# Patient Record
Sex: Male | Born: 1935 | Race: White | Hispanic: No | Marital: Married | Smoking: Never smoker
Health system: Southern US, Community
[De-identification: ages and names within clinical notes are randomized; demographics above are authoritative.]

## PROBLEM LIST (undated history)

## (undated) DIAGNOSIS — T8859XA Other complications of anesthesia, initial encounter: Secondary | ICD-10-CM

## (undated) DIAGNOSIS — S46009A Unspecified injury of muscle(s) and tendon(s) of the rotator cuff of unspecified shoulder, initial encounter: Secondary | ICD-10-CM

## (undated) DIAGNOSIS — K219 Gastro-esophageal reflux disease without esophagitis: Secondary | ICD-10-CM

## (undated) DIAGNOSIS — T4145XA Adverse effect of unspecified anesthetic, initial encounter: Secondary | ICD-10-CM

## (undated) DIAGNOSIS — I4891 Unspecified atrial fibrillation: Secondary | ICD-10-CM

## (undated) DIAGNOSIS — N189 Chronic kidney disease, unspecified: Secondary | ICD-10-CM

## (undated) DIAGNOSIS — C801 Malignant (primary) neoplasm, unspecified: Secondary | ICD-10-CM

## (undated) DIAGNOSIS — F419 Anxiety disorder, unspecified: Secondary | ICD-10-CM

## (undated) DIAGNOSIS — F32A Depression, unspecified: Secondary | ICD-10-CM

## (undated) DIAGNOSIS — Z8489 Family history of other specified conditions: Secondary | ICD-10-CM

## (undated) DIAGNOSIS — M109 Gout, unspecified: Secondary | ICD-10-CM

## (undated) DIAGNOSIS — Z9289 Personal history of other medical treatment: Secondary | ICD-10-CM

## (undated) DIAGNOSIS — R0602 Shortness of breath: Secondary | ICD-10-CM

## (undated) DIAGNOSIS — I82409 Acute embolism and thrombosis of unspecified deep veins of unspecified lower extremity: Secondary | ICD-10-CM

## (undated) DIAGNOSIS — M1990 Unspecified osteoarthritis, unspecified site: Secondary | ICD-10-CM

## (undated) DIAGNOSIS — I499 Cardiac arrhythmia, unspecified: Secondary | ICD-10-CM

## (undated) DIAGNOSIS — K439 Ventral hernia without obstruction or gangrene: Secondary | ICD-10-CM

## (undated) DIAGNOSIS — N400 Enlarged prostate without lower urinary tract symptoms: Secondary | ICD-10-CM

## (undated) DIAGNOSIS — I739 Peripheral vascular disease, unspecified: Secondary | ICD-10-CM

## (undated) DIAGNOSIS — F329 Major depressive disorder, single episode, unspecified: Secondary | ICD-10-CM

## (undated) DIAGNOSIS — K5700 Diverticulitis of small intestine with perforation and abscess without bleeding: Secondary | ICD-10-CM

## (undated) DIAGNOSIS — I10 Essential (primary) hypertension: Secondary | ICD-10-CM

## (undated) HISTORY — PX: COLOSTOMY: SHX63

## (undated) HISTORY — PX: ABDOMINAL HERNIA REPAIR: SHX539

## (undated) HISTORY — PX: HERNIA REPAIR: SHX51

---

## 2002-02-03 HISTORY — PX: JOINT REPLACEMENT: SHX530

## 2008-02-04 HISTORY — PX: EYE SURGERY: SHX253

## 2011-09-04 DIAGNOSIS — K5700 Diverticulitis of small intestine with perforation and abscess without bleeding: Secondary | ICD-10-CM

## 2011-09-04 HISTORY — DX: Diverticulitis of small intestine with perforation and abscess without bleeding: K57.00

## 2011-12-05 HISTORY — PX: KYPHOPLASTY: SHX5884

## 2012-11-17 ENCOUNTER — Other Ambulatory Visit: Payer: Self-pay | Admitting: Neurosurgery

## 2012-11-17 DIAGNOSIS — M5416 Radiculopathy, lumbar region: Secondary | ICD-10-CM

## 2012-11-17 DIAGNOSIS — M546 Pain in thoracic spine: Secondary | ICD-10-CM

## 2012-11-21 ENCOUNTER — Ambulatory Visit
Admission: RE | Admit: 2012-11-21 | Discharge: 2012-11-21 | Disposition: A | Discharge status: Home / Self Care | Payer: Medicare Other | Source: Ambulatory Visit | Attending: Neurosurgery | Admitting: Neurosurgery

## 2012-11-21 DIAGNOSIS — M546 Pain in thoracic spine: Secondary | ICD-10-CM

## 2012-11-21 DIAGNOSIS — M5416 Radiculopathy, lumbar region: Secondary | ICD-10-CM

## 2012-11-29 ENCOUNTER — Other Ambulatory Visit: Payer: Self-pay | Admitting: Neurosurgery

## 2012-11-29 ENCOUNTER — Other Ambulatory Visit: Payer: Self-pay

## 2012-12-02 ENCOUNTER — Encounter (HOSPITAL_COMMUNITY): Payer: Self-pay

## 2012-12-03 ENCOUNTER — Encounter (HOSPITAL_COMMUNITY): Payer: Self-pay

## 2012-12-03 ENCOUNTER — Encounter (HOSPITAL_COMMUNITY)
Admission: RE | Admit: 2012-12-03 | Discharge: 2012-12-03 | Disposition: A | Discharge status: Home / Self Care | Payer: Medicare Other | Source: Ambulatory Visit | Attending: Neurosurgery | Admitting: Neurosurgery

## 2012-12-03 DIAGNOSIS — Z01812 Encounter for preprocedural laboratory examination: Secondary | ICD-10-CM | POA: Insufficient documentation

## 2012-12-03 HISTORY — DX: Gastro-esophageal reflux disease without esophagitis: K21.9

## 2012-12-03 HISTORY — DX: Family history of other specified conditions: Z84.89

## 2012-12-03 HISTORY — DX: Diverticulitis of small intestine with perforation and abscess without bleeding: K57.00

## 2012-12-03 HISTORY — DX: Cardiac arrhythmia, unspecified: I49.9

## 2012-12-03 HISTORY — DX: Unspecified osteoarthritis, unspecified site: M19.90

## 2012-12-03 HISTORY — DX: Personal history of other medical treatment: Z92.89

## 2012-12-03 HISTORY — DX: Chronic kidney disease, unspecified: N18.9

## 2012-12-03 HISTORY — DX: Depression, unspecified: F32.A

## 2012-12-03 HISTORY — DX: Benign prostatic hyperplasia without lower urinary tract symptoms: N40.0

## 2012-12-03 HISTORY — DX: Gout, unspecified: M10.9

## 2012-12-03 HISTORY — DX: Acute embolism and thrombosis of unspecified deep veins of unspecified lower extremity: I82.409

## 2012-12-03 HISTORY — DX: Unspecified injury of muscle(s) and tendon(s) of the rotator cuff of unspecified shoulder, initial encounter: S46.009A

## 2012-12-03 HISTORY — DX: Anxiety disorder, unspecified: F41.9

## 2012-12-03 HISTORY — DX: Major depressive disorder, single episode, unspecified: F32.9

## 2012-12-03 HISTORY — DX: Ventral hernia without obstruction or gangrene: K43.9

## 2012-12-03 HISTORY — DX: Peripheral vascular disease, unspecified: I73.9

## 2012-12-03 HISTORY — DX: Malignant (primary) neoplasm, unspecified: C80.1

## 2012-12-03 LAB — BASIC METABOLIC PANEL
Calcium: 8.9 mg/dL (ref 8.4–10.5)
Chloride: 101 mEq/L (ref 96–112)
Creatinine, Ser: 2.54 mg/dL — ABNORMAL HIGH (ref 0.50–1.35)
GFR calc non Af Amer: 23 mL/min — ABNORMAL LOW (ref >90)
Glucose, Bld: 129 mg/dL — ABNORMAL HIGH (ref 70–99)
Sodium: 134 mEq/L — ABNORMAL LOW (ref 135–145)

## 2012-12-03 LAB — CBC
Platelets: 224 10*3/uL (ref 150–400)
RBC: 4.33 MIL/uL (ref 4.22–5.81)
WBC: 22.9 10*3/uL — ABNORMAL HIGH (ref 4.0–10.5)

## 2012-12-03 LAB — SURGICAL PCR SCREEN: Staphylococcus aureus: POSITIVE — AB

## 2012-12-03 NOTE — Progress Notes (Addendum)
I spoke with Shanda Bumps at Dr Rush Farmer office and notified her of PCR swab positive for Staph and MRSA and WBC of 22.9.  Pt was afebrile and no cough, or cold symptoms.

## 2012-12-03 NOTE — Pre-Procedure Instructions (Addendum)
Christopher Marshall  12/03/2012   Your procedure is scheduled on:  Tuesday, November 4th.  Report to [facility], Main Entrance/ Entrance "A" at 8:15 AM.  Call this number if you have problems the morning of surgery: [phone]   Remember:   Do not eat food or drink liquids after midnight.   Take these medicines the morning of surgery with A SIP OF WATER: allopurinol (ZYLOPRIM), amLODipine (NORVASC),  esomeprazole (NEXIUM), finasteride (PROSCAR), tamsulosin (FLOMAX).  Stop taking Aspirin, Coumadin, Plavix, Effient and herbal medications.  Do not take any NSAIDS ie:  Ibuprofen, Advil, Naproxen.   Do not wear jewelry, make-up or nail polish.  Do not wear lotions, powders, or perfumes. You may wear deodorant.  Do not shave 48 hours prior to surgery. Men may shave face and neck.  Do not bring valuables to the hospital.  [facility] is not responsible  for any belongings or valuables.               Contacts, dentures or bridgework may not be worn into surgery.  Leave suitcase in the car. After surgery it may be brought to your room.  For patients admitted to the hospital, discharge time is determined by your treatment team.               Patients discharged the day of surgery will not be allowed to drive home.  Name and phone number of your driver:-             Special Instructions: Shower using CHG 2 nights before surgery and the night before surgery.  If you shower the day of surgery use CHG.  Use special wash - you have one bottle of CHG for all showers.  You should use approximately 1/3 of the bottle for each shower.   Please read over the following fact sheets that you were given: Pain Booklet, Coughing and Deep Breathing and Surgical Site Infection Prevention

## 2012-12-06 ENCOUNTER — Encounter (HOSPITAL_COMMUNITY): Payer: Self-pay

## 2012-12-06 ENCOUNTER — Encounter (HOSPITAL_COMMUNITY): Payer: Self-pay | Admitting: Vascular Surgery

## 2012-12-06 MED ORDER — CEFAZOLIN SODIUM-DEXTROSE 2-3 GM-% IV SOLR: 2.0000 g | INTRAVENOUS | Status: DC

## 2012-12-06 NOTE — Progress Notes (Signed)
Anesthesia Chart Review:  Patient is a 78 year old male scheduled for T11 and T12 kyphoplasty on 12/07/12 by Dr. Venetia Maxon.  History includes non-smoker, PAD, GERD, BLE DVT, CKD with baseline Cr 1.8, anxiety, depression, dysrhythmia ("irregualr one time"), bilateral IHR, perforated diverticulitis s/p colon resection with colostomy, psoriatic and osteoarthritis, gout, skin cancer (BCC), right joint replacement, family history of post-operative N/V, left rotator cuff injury. PCP is listed as Dr. Suzy Bouchard in [place]. Patient has seen urologist Dr. August Saucer Clower in 07/2012 for voiding difficulties but no further urology follow-up was recommended.  Meds includes Remeron, allopurinol, Norvasc, Vitamin D, Colace, Nexium, Fergon, Proscar, Isopto tears, KCl, prednisone, Flomax, Coumadin. Dr. Fredrich Birks office has already instructed patient on when to hold Coumadin.  CXR and EKG received from [facility] were both > 69 year old.  (They were done on 12/02/11.)  They will need to be done on the day of surgery.  Preoperative labs noted.  Na 134, K 5.5.  BUN 41, Cr 2.54, glucose 129.  WBC 22.9K.  He is on chronic steroids.  Earlier today I called and received comparison labs from Dr. Kathee Polite office.  His last WBC was 14.2, BUN 35, Cr 1.8 on 09/28/12.  I spoke with Shanda Bumps at Dr. Fredrich Birks office multiple times today.  Dr. Venetia Maxon had already reviewed labs and had her call and review labs with Dr. Dorna Leitz.  She reports that Dr. Dorna Leitz is okay elevated WBC since patient is on steroids, but Dr. Venetia Maxon would still like a CBC repeated on the day of surgery. Dr. Grant Fontana also recommended letting Dr. Eugenie Birks know about renal labs, but Dr. Eugenie Birks told Shanda Bumps that he is no longer following patient and would not offer additional input.    I reviewed with Dr. Gelene Mink.  With increase in renal function to > 2.5 would recommend formal nephrology consult if this is an elective procedure.  Shanda Bumps will review with Dr. Venetia Maxon, and he was given Dr.  Thornton Dales portable phone number if any questions/concerns.    I would anticipate case being rescheduled.  If so, patient will still need CBC, PT/PTT, BMET done and an EKG, and CXR if not done within the past year.    Velna Ochs [facility] Short Stay Center/Anesthesiology Phone [phone] 12/06/2012 4:47 PM

## 2012-12-07 ENCOUNTER — Inpatient Hospital Stay (HOSPITAL_COMMUNITY): Admission: RE | Admit: 2012-12-07 | Payer: Medicare Other | Source: Ambulatory Visit | Admitting: Neurosurgery

## 2012-12-07 ENCOUNTER — Encounter (HOSPITAL_COMMUNITY): Admission: RE | Payer: Self-pay | Source: Ambulatory Visit

## 2012-12-07 SURGERY — KYPHOPLASTY
Anesthesia: General

## 2013-01-03 ENCOUNTER — Other Ambulatory Visit: Payer: Self-pay | Admitting: Neurosurgery

## 2013-01-06 ENCOUNTER — Encounter (HOSPITAL_COMMUNITY): Payer: Self-pay | Admitting: Vascular Surgery

## 2013-01-06 MED ORDER — VANCOMYCIN HCL IN DEXTROSE 1-5 GM/200ML-% IV SOLN
1000.0000 mg | INTRAVENOUS | Status: AC
  Administered 2013-01-07: 1000 mg via INTRAVENOUS
  Filled 2013-01-06: qty 200

## 2013-01-06 NOTE — Progress Notes (Signed)
Anesthesia follow-up: See my note from 12/03/12.  Patient's surgery is now scheduled for 01/07/13.  He is scheduled to be a same day work-up.  (He had not seen cardiology prior to rescheduling, and this week he has had cardiology visits or testing nearly every day in [place].)   Since October, he has been seen and cleared by nephrologist Dr. Cyndia Diver of [facility].  He was diagnosed with stage IV CKD and his Cr was up to 3.1.  Prior abdominal CT in 2013 showed right renal cyst with normal kidney size.    He saw cardiologist Dr. Yehuda Budd on 01/04/13. He was found to be in afib of uncertain duration. He was rate controlled.  Preoperative stress and echo were done showing mildly reduced LV function, no significant valvular diseae, and no ischemia on nuclear stress test.  Ultimately, he was felt low risk from a cardiac standpoint for planned procedure.    Echo on 01/06/13 showed normal LV size with moderate symmetric LVH, mild global hypokinesis, EF 45-50%, mitral inflow pattern is uniphasic due to afib, normal pulmonary artery pressure, mild AI, MR, TR, PI.  Nuclear stress test on 01/05/13 showed normal LVEDV, no TID. Small in size, moderate in severity fixed inferoapical wall defect consistent with attenuation artifact, no ischemia.  Grossly normal LV function and wall motion by visual estimation. EF by computer was 39%, but this is likely due to gating artifact from afib.    He will need a CXR and labs tomorrow, and perhaps another EKG if one not received from [facility].  Velna Ochs [facility] Short Stay Center/Anesthesiology Phone [phone] 01/06/2013 12:07 PM

## 2013-01-07 ENCOUNTER — Ambulatory Visit (HOSPITAL_COMMUNITY)
Admission: RE | Admit: 2013-01-07 | Discharge: 2013-01-08 | Disposition: A | Discharge status: Home / Self Care | Payer: Medicare Other | Source: Ambulatory Visit | Attending: Neurosurgery | Admitting: Neurosurgery

## 2013-01-07 ENCOUNTER — Encounter (HOSPITAL_COMMUNITY): Payer: Self-pay | Admitting: *Deleted

## 2013-01-07 ENCOUNTER — Ambulatory Visit (HOSPITAL_COMMUNITY): Payer: Medicare Other

## 2013-01-07 ENCOUNTER — Encounter (HOSPITAL_COMMUNITY): Payer: Medicare Other | Admitting: Vascular Surgery

## 2013-01-07 ENCOUNTER — Inpatient Hospital Stay (HOSPITAL_COMMUNITY): Payer: Medicare Other

## 2013-01-07 ENCOUNTER — Encounter (HOSPITAL_COMMUNITY)
Admission: RE | Disposition: A | Discharge status: Home / Self Care | Payer: Self-pay | Source: Ambulatory Visit | Attending: Neurosurgery

## 2013-01-07 ENCOUNTER — Inpatient Hospital Stay (HOSPITAL_COMMUNITY): Payer: Medicare Other | Admitting: Vascular Surgery

## 2013-01-07 DIAGNOSIS — Y834 Other reconstructive surgery as the cause of abnormal reaction of the patient, or of later complication, without mention of misadventure at the time of the procedure: Secondary | ICD-10-CM | POA: Insufficient documentation

## 2013-01-07 DIAGNOSIS — M545 Low back pain, unspecified: Secondary | ICD-10-CM | POA: Insufficient documentation

## 2013-01-07 DIAGNOSIS — I9589 Other hypotension: Secondary | ICD-10-CM | POA: Insufficient documentation

## 2013-01-07 DIAGNOSIS — M4000 Postural kyphosis, site unspecified: Secondary | ICD-10-CM | POA: Insufficient documentation

## 2013-01-07 DIAGNOSIS — X500XXA Overexertion from strenuous movement or load, initial encounter: Secondary | ICD-10-CM | POA: Insufficient documentation

## 2013-01-07 DIAGNOSIS — D649 Anemia, unspecified: Secondary | ICD-10-CM | POA: Insufficient documentation

## 2013-01-07 DIAGNOSIS — J810 Acute pulmonary edema: Secondary | ICD-10-CM

## 2013-01-07 DIAGNOSIS — I4891 Unspecified atrial fibrillation: Secondary | ICD-10-CM | POA: Insufficient documentation

## 2013-01-07 DIAGNOSIS — S22080A Wedge compression fracture of T11-T12 vertebra, initial encounter for closed fracture: Secondary | ICD-10-CM

## 2013-01-07 DIAGNOSIS — M4310 Spondylolisthesis, site unspecified: Secondary | ICD-10-CM | POA: Insufficient documentation

## 2013-01-07 DIAGNOSIS — R0902 Hypoxemia: Secondary | ICD-10-CM

## 2013-01-07 DIAGNOSIS — E872 Acidosis, unspecified: Secondary | ICD-10-CM | POA: Insufficient documentation

## 2013-01-07 DIAGNOSIS — S22009A Unspecified fracture of unspecified thoracic vertebra, initial encounter for closed fracture: Principal | ICD-10-CM | POA: Insufficient documentation

## 2013-01-07 DIAGNOSIS — I129 Hypertensive chronic kidney disease with stage 1 through stage 4 chronic kidney disease, or unspecified chronic kidney disease: Secondary | ICD-10-CM | POA: Insufficient documentation

## 2013-01-07 DIAGNOSIS — I2510 Atherosclerotic heart disease of native coronary artery without angina pectoris: Secondary | ICD-10-CM | POA: Insufficient documentation

## 2013-01-07 DIAGNOSIS — R0602 Shortness of breath: Secondary | ICD-10-CM | POA: Insufficient documentation

## 2013-01-07 DIAGNOSIS — I959 Hypotension, unspecified: Secondary | ICD-10-CM

## 2013-01-07 DIAGNOSIS — N184 Chronic kidney disease, stage 4 (severe): Secondary | ICD-10-CM | POA: Insufficient documentation

## 2013-01-07 DIAGNOSIS — Z7901 Long term (current) use of anticoagulants: Secondary | ICD-10-CM | POA: Insufficient documentation

## 2013-01-07 DIAGNOSIS — M810 Age-related osteoporosis without current pathological fracture: Secondary | ICD-10-CM | POA: Insufficient documentation

## 2013-01-07 HISTORY — PX: KYPHOPLASTY: SHX5884

## 2013-01-07 HISTORY — DX: Shortness of breath: R06.02

## 2013-01-07 HISTORY — DX: Essential (primary) hypertension: I10

## 2013-01-07 HISTORY — DX: Unspecified atrial fibrillation: I48.91

## 2013-01-07 LAB — CBC
HCT: 35.7 % — ABNORMAL LOW (ref 39.0–52.0)
Hemoglobin: 11.7 g/dL — ABNORMAL LOW (ref 13.0–17.0)
MCH: 29.8 pg (ref 26.0–34.0)
MCV: 91.1 fL (ref 78.0–100.0)
RBC: 3.92 MIL/uL — ABNORMAL LOW (ref 4.22–5.81)
RDW: 15.6 % — ABNORMAL HIGH (ref 11.5–15.5)

## 2013-01-07 LAB — GLUCOSE, CAPILLARY
Glucose-Capillary: 147 mg/dL — ABNORMAL HIGH (ref 70–99)
Glucose-Capillary: 118 mg/dL — ABNORMAL HIGH (ref 70–99)

## 2013-01-07 LAB — PROTIME-INR
INR: 1.67 — ABNORMAL HIGH (ref 0.00–1.49)
Prothrombin Time: 19.2 seconds — ABNORMAL HIGH (ref 11.6–15.2)

## 2013-01-07 LAB — BASIC METABOLIC PANEL
BUN: 58 mg/dL — ABNORMAL HIGH (ref 6–23)
CO2: 18 mEq/L — ABNORMAL LOW (ref 19–32)
Glucose, Bld: 106 mg/dL — ABNORMAL HIGH (ref 70–99)
Potassium: 4.2 mEq/L (ref 3.5–5.1)
Sodium: 135 mEq/L (ref 135–145)

## 2013-01-07 LAB — TROPONIN I
Troponin I: <0.3 ng/mL (ref <0.30)
Troponin I: <0.3 ng/mL (ref <0.30)
Troponin I: <0.3 ng/mL (ref <0.30)

## 2013-01-07 LAB — SURGICAL PCR SCREEN: MRSA, PCR: POSITIVE — AB

## 2013-01-07 SURGERY — KYPHOPLASTY
Anesthesia: General

## 2013-01-07 MED ORDER — MUPIROCIN 2 % EX OINT
TOPICAL_OINTMENT | Freq: Two times a day (BID) | CUTANEOUS | Status: DC
  Administered 2013-01-07 – 2013-01-08 (×3): via TOPICAL
  Filled 2013-01-07: qty 22

## 2013-01-07 MED ORDER — AMLODIPINE BESYLATE 5 MG PO TABS: 5.0000 mg | ORAL_TABLET | Freq: Every day | ORAL | Status: DC

## 2013-01-07 MED ORDER — PHENYLEPHRINE HCL 10 MG/ML IJ SOLN
30.0000 ug/min | INTRAVENOUS | Status: DC
  Administered 2013-01-07: 40 ug/min via INTRAVENOUS
  Filled 2013-01-07: qty 1

## 2013-01-07 MED ORDER — SODIUM CHLORIDE 0.9 % IV SOLN
INTRAVENOUS | Status: DC | PRN
  Administered 2013-01-07: 07:00:00 via INTRAVENOUS

## 2013-01-07 MED ORDER — LIDOCAINE-EPINEPHRINE 1 %-1:100000 IJ SOLN
INTRAMUSCULAR | Status: DC | PRN
  Administered 2013-01-07: 7.5 mL

## 2013-01-07 MED ORDER — ALBUMIN HUMAN 5 % IV SOLN
INTRAVENOUS | Status: DC | PRN
  Administered 2013-01-07: 08:00:00 via INTRAVENOUS

## 2013-01-07 MED ORDER — PANTOPRAZOLE SODIUM 40 MG PO TBEC: 40.0000 mg | DELAYED_RELEASE_TABLET | Freq: Every day | ORAL | Status: DC

## 2013-01-07 MED ORDER — ONDANSETRON HCL 4 MG/2ML IJ SOLN: 4.0000 mg | INTRAMUSCULAR | Status: DC | PRN

## 2013-01-07 MED ORDER — PANTOPRAZOLE SODIUM 40 MG PO TBEC
40.0000 mg | DELAYED_RELEASE_TABLET | Freq: Every day | ORAL | Status: DC
  Administered 2013-01-08: 40 mg via ORAL
  Filled 2013-01-07: qty 1

## 2013-01-07 MED ORDER — ALUM & MAG HYDROXIDE-SIMETH 200-200-20 MG/5ML PO SUSP: 30.0000 mL | Freq: Four times a day (QID) | ORAL | Status: DC | PRN

## 2013-01-07 MED ORDER — HYDROMORPHONE HCL PF 1 MG/ML IJ SOLN: 0.5000 mg | INTRAMUSCULAR | Status: DC | PRN

## 2013-01-07 MED ORDER — ACETAMINOPHEN 325 MG PO TABS: 650.0000 mg | ORAL_TABLET | ORAL | Status: DC | PRN

## 2013-01-07 MED ORDER — METOPROLOL TARTRATE 25 MG PO TABS
25.0000 mg | ORAL_TABLET | Freq: Two times a day (BID) | ORAL | Status: DC
  Filled 2013-01-07: qty 1

## 2013-01-07 MED ORDER — POLYETHYLENE GLYCOL 3350 17 G PO PACK
17.0000 g | PACK | Freq: Every day | ORAL | Status: DC | PRN
  Filled 2013-01-07: qty 1

## 2013-01-07 MED ORDER — ZOLPIDEM TARTRATE 5 MG PO TABS: 5.0000 mg | ORAL_TABLET | Freq: Every evening | ORAL | Status: DC | PRN

## 2013-01-07 MED ORDER — FENTANYL CITRATE 0.05 MG/ML IJ SOLN
INTRAMUSCULAR | Status: DC | PRN
  Administered 2013-01-07 (×2): 50 ug via INTRAVENOUS

## 2013-01-07 MED ORDER — HYDROMORPHONE HCL PF 1 MG/ML IJ SOLN: 0.2500 mg | INTRAMUSCULAR | Status: DC | PRN

## 2013-01-07 MED ORDER — FENTANYL CITRATE 0.05 MG/ML IJ SOLN: 50.0000 ug | Freq: Once | INTRAMUSCULAR | Status: DC

## 2013-01-07 MED ORDER — BISACODYL 10 MG RE SUPP: 10.0000 mg | Freq: Every day | RECTAL | Status: DC | PRN

## 2013-01-07 MED ORDER — DOCUSATE SODIUM 100 MG PO CAPS: 100.0000 mg | ORAL_CAPSULE | Freq: Two times a day (BID) | ORAL | Status: DC | PRN

## 2013-01-07 MED ORDER — TAMSULOSIN HCL 0.4 MG PO CAPS
0.4000 mg | ORAL_CAPSULE | Freq: Every day | ORAL | Status: DC
  Administered 2013-01-07: 0.4 mg via ORAL
  Filled 2013-01-07 (×2): qty 1

## 2013-01-07 MED ORDER — PHENOL 1.4 % MT LIQD: 1.0000 | OROMUCOSAL | Status: DC | PRN

## 2013-01-07 MED ORDER — FINASTERIDE 5 MG PO TABS
5.0000 mg | ORAL_TABLET | Freq: Every day | ORAL | Status: DC
  Administered 2013-01-08: 5 mg via ORAL
  Filled 2013-01-07: qty 1

## 2013-01-07 MED ORDER — PHENYLEPHRINE HCL 10 MG/ML IJ SOLN
10.0000 mg | INTRAVENOUS | Status: DC | PRN
  Administered 2013-01-07: 200 ug/min via INTRAVENOUS

## 2013-01-07 MED ORDER — PANTOPRAZOLE SODIUM 40 MG IV SOLR: 40.0000 mg | Freq: Every day | INTRAVENOUS | Status: DC

## 2013-01-07 MED ORDER — HYDROCORTISONE SOD SUCCINATE 100 MG IJ SOLR
50.0000 mg | Freq: Four times a day (QID) | INTRAMUSCULAR | Status: DC
  Administered 2013-01-07 – 2013-01-08 (×5): 50 mg via INTRAVENOUS
  Filled 2013-01-07 (×8): qty 1

## 2013-01-07 MED ORDER — PREDNISONE 2.5 MG PO TABS
12.5000 mg | ORAL_TABLET | Freq: Every day | ORAL | Status: DC
  Filled 2013-01-07: qty 1

## 2013-01-07 MED ORDER — SODIUM CHLORIDE 0.9 % IV BOLUS (SEPSIS)
500.0000 mL | Freq: Once | INTRAVENOUS | Status: AC
  Administered 2013-01-07: 500 mL via INTRAVENOUS

## 2013-01-07 MED ORDER — GLYCOPYRROLATE 0.2 MG/ML IJ SOLN
INTRAMUSCULAR | Status: DC | PRN
  Administered 2013-01-07: .8 mg via INTRAVENOUS

## 2013-01-07 MED ORDER — ONDANSETRON HCL 4 MG/2ML IJ SOLN
INTRAMUSCULAR | Status: DC | PRN
  Administered 2013-01-07: 4 mg via INTRAVENOUS

## 2013-01-07 MED ORDER — SODIUM CHLORIDE 0.9 % IJ SOLN
3.0000 mL | Freq: Two times a day (BID) | INTRAMUSCULAR | Status: DC
  Administered 2013-01-07: 3 mL via INTRAVENOUS

## 2013-01-07 MED ORDER — INSULIN ASPART 100 UNIT/ML ~~LOC~~ SOLN
0.0000 [IU] | SUBCUTANEOUS | Status: DC
  Administered 2013-01-07 (×3): 2 [IU] via SUBCUTANEOUS

## 2013-01-07 MED ORDER — MENTHOL 3 MG MT LOZG: 1.0000 | LOZENGE | OROMUCOSAL | Status: DC | PRN

## 2013-01-07 MED ORDER — NEOSTIGMINE METHYLSULFATE 1 MG/ML IJ SOLN
INTRAMUSCULAR | Status: DC | PRN
  Administered 2013-01-07: 5 mg via INTRAVENOUS

## 2013-01-07 MED ORDER — PHENYLEPHRINE HCL 10 MG/ML IJ SOLN
INTRAMUSCULAR | Status: DC | PRN
  Administered 2013-01-07 (×3): 120 ug via INTRAVENOUS

## 2013-01-07 MED ORDER — SENNA 8.6 MG PO TABS
1.0000 | ORAL_TABLET | Freq: Two times a day (BID) | ORAL | Status: DC
  Administered 2013-01-07 – 2013-01-08 (×2): 8.6 mg via ORAL
  Filled 2013-01-07 (×4): qty 1

## 2013-01-07 MED ORDER — 0.9 % SODIUM CHLORIDE (POUR BTL) OPTIME
TOPICAL | Status: DC | PRN
  Administered 2013-01-07: 1000 mL

## 2013-01-07 MED ORDER — OXYCODONE-ACETAMINOPHEN 5-325 MG PO TABS: 1.0000 | ORAL_TABLET | ORAL | Status: DC | PRN

## 2013-01-07 MED ORDER — ALLOPURINOL 100 MG PO TABS
100.0000 mg | ORAL_TABLET | Freq: Every day | ORAL | Status: DC
  Administered 2013-01-08: 100 mg via ORAL
  Filled 2013-01-07: qty 1

## 2013-01-07 MED ORDER — FLEET ENEMA 7-19 GM/118ML RE ENEM
1.0000 | ENEMA | Freq: Once | RECTAL | Status: AC | PRN
  Filled 2013-01-07: qty 1

## 2013-01-07 MED ORDER — BUPIVACAINE HCL 0.5 % IJ SOLN
INTRAMUSCULAR | Status: DC | PRN
  Administered 2013-01-07: 7.5 mL

## 2013-01-07 MED ORDER — ROCURONIUM BROMIDE 100 MG/10ML IV SOLN
INTRAVENOUS | Status: DC | PRN
  Administered 2013-01-07: 40 mg via INTRAVENOUS

## 2013-01-07 MED ORDER — DOCUSATE SODIUM 100 MG PO CAPS
100.0000 mg | ORAL_CAPSULE | Freq: Two times a day (BID) | ORAL | Status: DC
  Administered 2013-01-07 – 2013-01-08 (×2): 100 mg via ORAL
  Filled 2013-01-07 (×4): qty 1

## 2013-01-07 MED ORDER — SODIUM CHLORIDE 0.9 % IV SOLN: 250.0000 mL | INTRAVENOUS | Status: DC

## 2013-01-07 MED ORDER — ACETAMINOPHEN 650 MG RE SUPP: 650.0000 mg | RECTAL | Status: DC | PRN

## 2013-01-07 MED ORDER — LIDOCAINE HCL (CARDIAC) 20 MG/ML IV SOLN
INTRAVENOUS | Status: DC | PRN
  Administered 2013-01-07: 80 mg via INTRAVENOUS

## 2013-01-07 MED ORDER — METHOCARBAMOL 500 MG PO TABS
500.0000 mg | ORAL_TABLET | Freq: Four times a day (QID) | ORAL | Status: DC | PRN
  Filled 2013-01-07: qty 1

## 2013-01-07 MED ORDER — MIDAZOLAM HCL 2 MG/2ML IJ SOLN: 1.0000 mg | INTRAMUSCULAR | Status: DC | PRN

## 2013-01-07 MED ORDER — CYCLOBENZAPRINE HCL 5 MG PO TABS
5.0000 mg | ORAL_TABLET | Freq: Three times a day (TID) | ORAL | Status: DC | PRN
  Filled 2013-01-07: qty 1

## 2013-01-07 MED ORDER — METHOCARBAMOL 100 MG/ML IJ SOLN
500.0000 mg | Freq: Four times a day (QID) | INTRAVENOUS | Status: DC | PRN
  Filled 2013-01-07: qty 5

## 2013-01-07 MED ORDER — HYDROMORPHONE HCL 2 MG PO TABS: 2.0000 mg | ORAL_TABLET | ORAL | Status: DC | PRN

## 2013-01-07 MED ORDER — EPHEDRINE SULFATE 50 MG/ML IJ SOLN
INTRAMUSCULAR | Status: DC | PRN
  Administered 2013-01-07 (×3): 5 mg via INTRAVENOUS

## 2013-01-07 MED ORDER — VITAMIN D3 25 MCG (1000 UNIT) PO TABS
1000.0000 [IU] | ORAL_TABLET | Freq: Every day | ORAL | Status: DC
  Administered 2013-01-07 – 2013-01-08 (×2): 1000 [IU] via ORAL
  Filled 2013-01-07 (×2): qty 1

## 2013-01-07 MED ORDER — HYDROCODONE-ACETAMINOPHEN 5-325 MG PO TABS
1.0000 | ORAL_TABLET | ORAL | Status: DC | PRN
  Administered 2013-01-07 (×2): 2 via ORAL
  Filled 2013-01-07 (×2): qty 2

## 2013-01-07 MED ORDER — KCL IN DEXTROSE-NACL 20-5-0.45 MEQ/L-%-% IV SOLN
INTRAVENOUS | Status: DC
  Administered 2013-01-07: 12:00:00 via INTRAVENOUS
  Filled 2013-01-07 (×4): qty 1000

## 2013-01-07 MED ORDER — POTASSIUM CHLORIDE CRYS ER 20 MEQ PO TBCR
20.0000 meq | EXTENDED_RELEASE_TABLET | Freq: Every day | ORAL | Status: DC
  Administered 2013-01-07: 20 meq via ORAL
  Filled 2013-01-07 (×2): qty 1

## 2013-01-07 MED ORDER — FERROUS GLUCONATE 324 (38 FE) MG PO TABS
324.0000 mg | ORAL_TABLET | Freq: Every day | ORAL | Status: DC
Start: 2013-01-08 — End: 2013-01-08
  Administered 2013-01-08: 324 mg via ORAL
  Filled 2013-01-07 (×2): qty 1

## 2013-01-07 MED ORDER — SODIUM CHLORIDE 0.9 % IJ SOLN: 3.0000 mL | INTRAMUSCULAR | Status: DC | PRN

## 2013-01-07 MED ORDER — PHENYLEPHRINE HCL 10 MG/ML IJ SOLN
30.0000 ug/min | INTRAVENOUS | Status: DC
  Administered 2013-01-07 – 2013-01-08 (×2): 22 ug/min via INTRAVENOUS
  Filled 2013-01-07 (×2): qty 1

## 2013-01-07 MED ORDER — IOHEXOL 300 MG/ML  SOLN
INTRAMUSCULAR | Status: DC | PRN
  Administered 2013-01-07: 50 mL

## 2013-01-07 MED ORDER — MIRTAZAPINE 15 MG PO TABS
15.0000 mg | ORAL_TABLET | Freq: Every day | ORAL | Status: DC
  Administered 2013-01-07: 15 mg via ORAL
  Filled 2013-01-07 (×2): qty 1

## 2013-01-07 MED ORDER — PROPOFOL 10 MG/ML IV BOLUS
INTRAVENOUS | Status: DC | PRN
  Administered 2013-01-07: 120 mg via INTRAVENOUS

## 2013-01-07 MED ORDER — ARTIFICIAL TEARS OP OINT
TOPICAL_OINTMENT | OPHTHALMIC | Status: DC | PRN
  Administered 2013-01-07: 1 via OPHTHALMIC

## 2013-01-07 MED ORDER — MUPIROCIN 2 % EX OINT
TOPICAL_OINTMENT | CUTANEOUS | Status: AC
  Filled 2013-01-07: qty 22

## 2013-01-07 MED ORDER — HYPROMELLOSE (GONIOSCOPIC) 2.5 % OP SOLN: 1.0000 [drp] | Freq: Four times a day (QID) | OPHTHALMIC | Status: DC | PRN

## 2013-01-07 SURGICAL SUPPLY — 45 items
BLADE SURG 15 STRL LF DISP TIS (BLADE) ×1 IMPLANT
BLADE SURG 15 STRL SS (BLADE) ×1
BLADE SURG ROTATE 9660 (MISCELLANEOUS) IMPLANT
CEMENT BONE KYPHX HV R (Orthopedic Implant) ×2 IMPLANT
CEMENT KYPHON C01A KIT/MIXER (Cement) ×2 IMPLANT
CONT SPEC 4OZ CLIKSEAL STRL BL (MISCELLANEOUS) ×4 IMPLANT
DERMABOND ADHESIVE PROPEN (GAUZE/BANDAGES/DRESSINGS) ×1
DERMABOND ADVANCED (GAUZE/BANDAGES/DRESSINGS) ×1
DERMABOND ADVANCED .7 DNX12 (GAUZE/BANDAGES/DRESSINGS) ×1 IMPLANT
DERMABOND ADVANCED .7 DNX6 (GAUZE/BANDAGES/DRESSINGS) ×1 IMPLANT
DRAPE C-ARM 42X72 X-RAY (DRAPES) ×2 IMPLANT
DRAPE LAPAROTOMY 100X72X124 (DRAPES) ×2 IMPLANT
DRAPE PROXIMA HALF (DRAPES) ×2 IMPLANT
DRAPE SURG 17X23 STRL (DRAPES) ×2 IMPLANT
DRESSING TELFA 8X3 (GAUZE/BANDAGES/DRESSINGS) IMPLANT
DURAPREP 26ML APPLICATOR (WOUND CARE) ×2 IMPLANT
GAUZE SPONGE 4X4 16PLY XRAY LF (GAUZE/BANDAGES/DRESSINGS) ×2 IMPLANT
GLOVE BIO SURGEON STRL SZ8 (GLOVE) ×2 IMPLANT
GLOVE BIOGEL PI IND STRL 7.0 (GLOVE) ×1 IMPLANT
GLOVE BIOGEL PI IND STRL 8.5 (GLOVE) ×1 IMPLANT
GLOVE BIOGEL PI INDICATOR 7.0 (GLOVE) ×1
GLOVE BIOGEL PI INDICATOR 8.5 (GLOVE) ×1
GLOVE EXAM NITRILE LRG STRL (GLOVE) IMPLANT
GLOVE EXAM NITRILE MD LF STRL (GLOVE) IMPLANT
GLOVE EXAM NITRILE XL STR (GLOVE) IMPLANT
GLOVE EXAM NITRILE XS STR PU (GLOVE) IMPLANT
GLOVE SURG SS PI 7.0 STRL IVOR (GLOVE) ×4 IMPLANT
GOWN BRE IMP SLV AUR LG STRL (GOWN DISPOSABLE) IMPLANT
GOWN BRE IMP SLV AUR XL STRL (GOWN DISPOSABLE) ×4 IMPLANT
GOWN STRL REIN 2XL LVL4 (GOWN DISPOSABLE) IMPLANT
KIT BASIN OR (CUSTOM PROCEDURE TRAY) ×2 IMPLANT
KIT ROOM TURNOVER OR (KITS) ×2 IMPLANT
NEEDLE HYPO 25X1 1.5 SAFETY (NEEDLE) ×2 IMPLANT
NS IRRIG 1000ML POUR BTL (IV SOLUTION) ×2 IMPLANT
PACK SURGICAL SETUP 50X90 (CUSTOM PROCEDURE TRAY) ×2 IMPLANT
PAD ARMBOARD 7.5X6 YLW CONV (MISCELLANEOUS) ×6 IMPLANT
SOFAMOR DANEK CEMENT CARTRIDGE ×4 IMPLANT
STAPLER SKIN PROX WIDE 3.9 (STAPLE) ×2 IMPLANT
SUT VIC AB 3-0 SH 8-18 (SUTURE) ×2 IMPLANT
SYR CONTROL 10ML LL (SYRINGE) ×4 IMPLANT
TOWEL OR 17X24 6PK STRL BLUE (TOWEL DISPOSABLE) ×2 IMPLANT
TOWEL OR 17X26 10 PK STRL BLUE (TOWEL DISPOSABLE) ×2 IMPLANT
TRAY KYPHOPAK 15/3 ONESTEP 1ST (MISCELLANEOUS) ×2 IMPLANT
TRAY KYPHOPAK 20/3 ONESTEP 1ST (MISCELLANEOUS) IMPLANT
TRAY KYPHOPAK 20/3 ONESTEP CDS (KITS) ×2 IMPLANT

## 2013-01-07 NOTE — Op Note (Signed)
01/07/2013  8:44 AM  PATIENT:  Christopher Marshall  78 y.o. male  PRE-OPERATIVE DIAGNOSIS:  Thoracic 11 and 12 fractures, Lumbago, Kyphosis, osteoporosis   POST-OPERATIVE DIAGNOSIS:  Thoracic 11 and 12 fractures, Lumbago, Kyphosis, osteoporosis   PROCEDURE:  Procedure(s) with comments: T11-T12 Kyphoplasty (N/A) - T11-T12 Kyphoplasty  SURGEON:  Surgeon(s) and Role:    * Maeola Harman, MD - Primary  PHYSICIAN ASSISTANT:   ASSISTANTS: none   ANESTHESIA:   general  EBL:     BLOOD ADMINISTERED:none  DRAINS: none   LOCAL MEDICATIONS USED:  LIDOCAINE   SPECIMEN:  No Specimen  DISPOSITION OF SPECIMEN:  N/A  COUNTS:  YES  TOURNIQUET:  * No tourniquets in log *  DICTATION: DICTATION: Patient is 78 year old man with severe back pain and kyphosis.  He previously had an L1  compression fracture and has now developed painful  T 12 and T 11 compression fractures, which are proving debilitatingly painful to him. He has multiple medical co-morbidities, including hear and kidney disease and is on chronic anti-coagulation with coumadin. It was elected to take him to surgery for kyphoplasty procedure at T 11 and T 12 levels.  PROCEDURE:  Following the smooth and uncomplicated induction of general endotracheal anesthesia, the patient was placed in a prone position on chest and pelvic rolls.  C-arm fluoroscopy was positioned in both the AP and lateral planes, centered on the T 11 and T 12 vertebra.  His back was prepped and draped in the usual sterile fashion with Duraprep.  Using a left uni-pedicular approach, the left T11 pedicle and vertebral body were entered with the trochar using standard landmarks.  The drill was used, followed by a 20 cc Kyphon balloon, which was used to re-expand the broken vertebra.  Subsequently, 8 cc of bone cement was placed into the void created by the balloon and was seen to fill the fracture cleft and fill the vertebra in both the AP and lateral direction with good  interdigitation and with a small amount of apparent extravasation laterally. Using a bi-pedicular uni-pedicular approach, the  T12 pedicles and vertebral body were entered with the trochar using standard landmarks.  The drill was used, followed by a 20 cc Kyphon balloon, which was used to re-expand the broken vertebra.  This level was extremely collapsed and we were able to place approximately 4 cc of bone cement at this level, which was placed into the void created by the balloon and was seen to fill the fracture cleft and fill the vertebra in both the AP and lateral direction with good interdigitation and with a small amount of apparent extravasation superiorly into the T 11/ 12 disc space, at which point the fill was stopped.The bone void fillers were then removed.  Final X-ray demonstrated good filling within the fractured vertebrae.  The incisions were closed with   3-0 vicryl stitches and dressed with Dermabond. The patient was returned to the OR gurney and extubated in the OR and taken to Recovery in stable and satisfactory condition, having tolerated the procedure well.  Counts were correct at the end of the case.   PLAN OF CARE: Admit for overnight observation  PATIENT DISPOSITION:  PACU - hemodynamically stable.   Delay start of Pharmacological VTE agent (>24hrs) due to surgical blood loss or risk of bleeding: yes

## 2013-01-07 NOTE — Brief Op Note (Signed)
01/07/2013  8:44 AM  PATIENT:  Christopher Marshall  78 y.o. male  PRE-OPERATIVE DIAGNOSIS:  Thoracic 11 and 12 fractures, Lumbago, Kyphosis, osteoporosis   POST-OPERATIVE DIAGNOSIS:  Thoracic 11 and 12 fractures, Lumbago, Kyphosis, osteoporosis   PROCEDURE:  Procedure(s) with comments: T11-T12 Kyphoplasty (N/A) - T11-T12 Kyphoplasty  SURGEON:  Surgeon(s) and Role:    * Leeza Heiner, MD - Primary  PHYSICIAN ASSISTANT:   ASSISTANTS: none   ANESTHESIA:   general  EBL:     BLOOD ADMINISTERED:none  DRAINS: none   LOCAL MEDICATIONS USED:  LIDOCAINE   SPECIMEN:  No Specimen  DISPOSITION OF SPECIMEN:  N/A  COUNTS:  YES  TOURNIQUET:  * No tourniquets in log *  DICTATION: DICTATION: Patient is 78 year old man with severe back pain and kyphosis.  He previously had an L1  compression fracture and has now developed painful  T 12 and T 11 compression fractures, which are proving debilitatingly painful to him. He has multiple medical co-morbidities, including hear and kidney disease and is on chronic anti-coagulation with coumadin. It was elected to take him to surgery for kyphoplasty procedure at T 11 and T 12 levels.  PROCEDURE:  Following the smooth and uncomplicated induction of general endotracheal anesthesia, the patient was placed in a prone position on chest and pelvic rolls.  C-arm fluoroscopy was positioned in both the AP and lateral planes, centered on the T 11 and T 12 vertebra.  His back was prepped and draped in the usual sterile fashion with Duraprep.  Using a left uni-pedicular approach, the left T11 pedicle and vertebral body were entered with the trochar using standard landmarks.  The drill was used, followed by a 20 cc Kyphon balloon, which was used to re-expand the broken vertebra.  Subsequently, 8 cc of bone cement was placed into the void created by the balloon and was seen to fill the fracture cleft and fill the vertebra in both the AP and lateral direction with good  interdigitation and with a small amount of apparent extravasation laterally. Using a bi-pedicular uni-pedicular approach, the  T12 pedicles and vertebral body were entered with the trochar using standard landmarks.  The drill was used, followed by a 20 cc Kyphon balloon, which was used to re-expand the broken vertebra.  This level was extremely collapsed and we were able to place approximately 4 cc of bone cement at this level, which was placed into the void created by the balloon and was seen to fill the fracture cleft and fill the vertebra in both the AP and lateral direction with good interdigitation and with a small amount of apparent extravasation superiorly into the T 11/ 12 disc space, at which point the fill was stopped.The bone void fillers were then removed.  Final X-ray demonstrated good filling within the fractured vertebrae.  The incisions were closed with   3-0 vicryl stitches and dressed with Dermabond. The patient was returned to the OR gurney and extubated in the OR and taken to Recovery in stable and satisfactory condition, having tolerated the procedure well.  Counts were correct at the end of the case.   PLAN OF CARE: Admit for overnight observation  PATIENT DISPOSITION:  PACU - hemodynamically stable.   Delay start of Pharmacological VTE agent (>24hrs) due to surgical blood loss or risk of bleeding: yes  

## 2013-01-07 NOTE — Plan of Care (Signed)
Problem: Consults Goal: Diagnosis - Spinal Surgery Outcome: Completed/Met Date Met:  01/07/13 Thoraco/Lumbar Spine Fusion Kyphoplasty

## 2013-01-07 NOTE — Anesthesia Preprocedure Evaluation (Addendum)
Anesthesia Evaluation  Patient identified by MRN, date of birth, ID band Patient awake    Reviewed: Allergy & Precautions, H&P , NPO status , Patient's Chart, lab work & pertinent test results  Airway Mallampati: II TM Distance: >3 FB Neck ROM: Full    Dental  (+) Missing, Chipped, Loose, Poor Dentition and Dental Advisory Given   Pulmonary shortness of breath,  breath sounds clear to auscultation        Cardiovascular hypertension, + Peripheral Vascular Disease + dysrhythmias Atrial Fibrillation Rhythm:Irregular Rate:Normal     Neuro/Psych Depression  Neuromuscular disease    GI/Hepatic GERD-  ,  Endo/Other    Renal/GU      Musculoskeletal   Abdominal (+) + obese,   Peds  Hematology   Anesthesia Other Findings   Reproductive/Obstetrics                          Anesthesia Physical Anesthesia Plan  ASA: III  Anesthesia Plan: General   Post-op Pain Management:    Induction: Intravenous  Airway Management Planned: Oral ETT  Additional Equipment:   Intra-op Plan:   Post-operative Plan: Extubation in OR  Informed Consent: I have reviewed the patients History and Physical, chart, labs and discussed the procedure including the risks, benefits and alternatives for the proposed anesthesia with the patient or authorized representative who has indicated his/her understanding and acceptance.     Plan Discussed with: CRNA and Surgeon  Anesthesia Plan Comments:         Anesthesia Quick Evaluation

## 2013-01-07 NOTE — Progress Notes (Signed)
Pt. Still with low BP , DR. Gypsy Balsam and Venetia Maxon aware, CCM consulted and Neo ordered

## 2013-01-07 NOTE — Anesthesia Procedure Notes (Signed)
Procedure Name: Intubation Date/Time: 01/07/2013 7:33 AM Performed by: Lanell Matar Pre-anesthesia Checklist: Patient identified, Timeout performed, Emergency Drugs available, Suction available and Patient being monitored Patient Re-evaluated:Patient Re-evaluated prior to inductionOxygen Delivery Method: Circle system utilized Preoxygenation: Pre-oxygenation with 100% oxygen Intubation Type: IV induction Ventilation: Mask ventilation without difficulty Laryngoscope Size: Miller and 2 Grade View: Grade I Tube type: Oral Tube size: 7.5 mm Number of attempts: 1 Airway Equipment and Method: Stylet Placement Confirmation: ETT inserted through vocal cords under direct vision,  positive ETCO2,  CO2 detector and breath sounds checked- equal and bilateral Secured at: 22 cm Tube secured with: Tape Dental Injury: Teeth and Oropharynx as per pre-operative assessment

## 2013-01-07 NOTE — Progress Notes (Signed)
Pt.s BP 82 systolic, started at 95, Dr. Gypsy Balsam aware and fluid bolus given

## 2013-01-07 NOTE — Transfer of Care (Signed)
Immediate Anesthesia Transfer of Care Note  Patient: Christopher Marshall  Procedure(s) Performed: Procedure(s) with comments: T11-T12 Kyphoplasty (N/A) - T11-T12 Kyphoplasty  Patient Location: PACU  Anesthesia Type:General  Level of Consciousness: awake, alert  and oriented  Airway & Oxygen Therapy: Patient Spontanous Breathing and Patient connected to face mask oxygen  Post-op Assessment: Report given to PACU RN, Post -op Vital signs reviewed and stable and Patient moving all extremities X 4  Post vital signs: Reviewed and stable  Complications: No apparent anesthesia complications

## 2013-01-07 NOTE — Consult Note (Signed)
PULMONARY  / CRITICAL CARE MEDICINE  Name: Christopher Marshall MRN: [id number] DOB: 20-May-1934    ADMISSION DATE:  01/07/2013 CONSULTATION DATE:  12/5  REFERRING MD :  Venetia Maxon  PRIMARY SERVICE: Venetia Maxon   CHIEF COMPLAINT:  Hypotension   BRIEF PATIENT DESCRIPTION:  This is a 78 year old male with multiple medical co-morbids. Underwent elective kyphoplasty on 12/5, On arrival to PACU noted to be hypotensive. Approx 700 ml Fluid bolus administered. Nursing concerned about increased WOB so PCCM asked to assist in evaluation of hypotension.   SIGNIFICANT EVENTS / STUDIES:  Elective kyphoplasty 12/5   LINES / TUBES:   CULTURES: mrsa screen 12/5: pos   ANTIBIOTICS: vanc X1 12/5  HISTORY OF PRESENT ILLNESS:   See above   PAST MEDICAL HISTORY :  Past Medical History  Diagnosis Date  . DVT (deep venous thrombosis)     both legs  . Gout   . Arthritis     osteo and saroactic  . Family history of anesthesia complication     Daughter- N/V  . Peripheral vascular disease   . Anxiety   . Depression     per family  . Perforated diverticulum of duodenum 09/2011  . BPH (benign prostatic hyperplasia)   . GERD (gastroesophageal reflux disease)   . Hernia of abdominal wall   . Cancer     Skin cancerrighgt arm- basal  . History of blood transfusion     hip surgery  . Rotator cuff injury     left  . Chronic kidney disease     Stage IV CKD; Dr. Francesca Jewett at [facility] in [place]  . Dysrhythmia     irregular one time  . A-fib     Dr. Yehuda Budd in [place]  . Hypertension   . Shortness of breath    Past Surgical History  Procedure Laterality Date  . Colostomy    . Hernia repair Bilateral     inguinal  . Eye surgery Bilateral 2010    Cataract  . Abdominal hernia repair    . Kyphoplasty  12/2011  . Joint replacement Right 2004    hip   Prior to Admission medications   Medication Sig Start Date End Date Taking? Authorizing Provider  allopurinol (ZYLOPRIM) 100  MG tablet Take 100 mg by mouth daily.   Yes Historical Provider, MD  amLODipine (NORVASC) 5 MG tablet Take 5 mg by mouth daily.   Yes Historical Provider, MD  cholecalciferol (VITAMIN D) 1000 UNITS tablet Take 1,000 Units by mouth daily.   Yes Historical Provider, MD  cyclobenzaprine (FLEXERIL) 5 MG tablet Take 5 mg by mouth 3 (three) times daily as needed for muscle spasms.   Yes Historical Provider, MD  docusate sodium (COLACE) 100 MG capsule Take 100 mg by mouth 2 (two) times daily as needed for constipation.   Yes Historical Provider, MD  esomeprazole (NEXIUM) 40 MG capsule Take 40 mg by mouth daily before breakfast.   Yes Historical Provider, MD  ferrous gluconate (FERGON) 324 MG tablet Take 324 mg by mouth daily with breakfast.   Yes Historical Provider, MD  finasteride (PROSCAR) 5 MG tablet Take 5 mg by mouth daily.   Yes Historical Provider, MD  HYDROmorphone (DILAUDID) 2 MG tablet Take by mouth every 4 (four) hours as needed for severe pain.   Yes Historical Provider, MD  hydroxypropyl methylcellulose (ISOPTO TEARS) 2.5 % ophthalmic solution Place 1 drop into both eyes 4 (four) times daily as needed (dry  eyes).   Yes Historical Provider, MD  metoprolol tartrate (LOPRESSOR) 25 MG tablet Take 25 mg by mouth 2 (two) times daily.   Yes Historical Provider, MD  mirtazapine (REMERON) 15 MG tablet Take 15 mg by mouth at bedtime.   Yes Historical Provider, MD  pantoprazole (PROTONIX) 40 MG tablet Take 40 mg by mouth daily.   Yes Historical Provider, MD  potassium chloride SA (K-DUR,KLOR-CON) 20 MEQ tablet Take 20 mEq by mouth daily.   Yes Historical Provider, MD  predniSONE (DELTASONE) 10 MG tablet Take 12.5 mg by mouth daily. Weaning   Yes Historical Provider, MD  tamsulosin (FLOMAX) 0.4 MG CAPS capsule Take 0.4 mg by mouth at bedtime.   Yes Historical Provider, MD  warfarin (COUMADIN) 2 MG tablet Take 1-2 mg by mouth daily. 2 mg alternating with 1 mg daily   Yes Historical Provider, MD    Allergies  Allergen Reactions  . Amoxicillin Other (See Comments)    Unknown   . Cephalosporins Other (See Comments)    Unknown   . Codeine Other (See Comments)    Unknown   . Trimethoprim Other (See Comments)    Unknown     FAMILY HISTORY:  History reviewed. No pertinent family history. SOCIAL HISTORY:  reports that he has never smoked. He does not have any smokeless tobacco history on file. He reports that he does not drink alcohol or use illicit drugs.  REVIEW OF SYSTEMS:   Review of Systems:   Bolds are positive  Constitutional: weight loss, gain, night sweats, Fevers, chills, fatigue .  HEENT: headaches, Sore throat, sneezing, nasal congestion, post nasal drip, Difficulty swallowing, Tooth/dental problems, visual complaints visual changes, ear ache CV:  chest pain, radiates: ,Orthopnea, PND, swelling in lower extremities, dizziness, palpitations, syncope.  GI  heartburn, indigestion, abdominal pain, nausea, vomiting, diarrhea, change in bowel habits, loss of appetite, bloody stools.  Resp: cough, productive: , hemoptysis, dyspnea, chest pain, pleuritic.  Skin: rash or itching or icterus GU: dysuria, change in color of urine, urgency or frequency. flank pain, hematuria  MS: joint pain or swelling. decreased range of motion  Psych: change in mood or affect. depression or anxiety.  Neuro: difficulty with speech, weakness, numbness, ataxia   * all above unremarkable   SUBJECTIVE:  No distress  VITAL SIGNS: Temp:  [97.1 F (36.2 C)-97.7 F (36.5 C)] 97.1 F (36.2 C) (12/05 0846) Pulse Rate:  [54-97] 83 (12/05 1000) Resp:  [17-21] 20 (12/05 1000) BP: (74-95)/(41-70) 80/43 mmHg (12/05 1000) SpO2:  [82 %-99 %] 92 % (12/05 1000) Weight:  [102.513 kg (226 lb)] 102.513 kg (226 lb) (12/05 0645) HEMODYNAMICS:   VENTILATOR SETTINGS:   INTAKE / OUTPUT: Intake/Output     12/04 0701 - 12/05 0700 12/05 0701 - 12/06 0700   I.V. (mL/kg)  900 (8.8)   IV Piggyback  250    Total Intake(mL/kg)  1150 (11.2)   Net   +1150          PHYSICAL EXAMINATION: General:  No distress  Neuro:  Intact  HEENT:  No JVD or adenopathy  Cardiovascular:  rrr Lungs:  Fine basilar rales  Abdomen:  Soft, non-tender, + bowel sounds  Musculoskeletal:  Intact  Skin:  Intact   LABS:  CBC  Recent Labs Lab 01/07/13 0620  WBC 18.5*  HGB 11.7*  HCT 35.7*  PLT 232   Coag's  Recent Labs Lab 01/07/13 0620  APTT 29  INR 1.67*   BMET  Recent Labs Lab 01/07/13  0620  NA 135  K 4.2  CL 102  CO2 18*  BUN 58*  CREATININE 3.30*  GLUCOSE 106*   Electrolytes  Recent Labs Lab 01/07/13 0620  CALCIUM 8.6   Sepsis Markers No results found for this basename: LATICACIDVEN, PROCALCITON, O2SATVEN,  in the last 168 hours ABG No results found for this basename: PHART, PCO2ART, PO2ART,  in the last 168 hours Liver Enzymes No results found for this basename: AST, ALT, ALKPHOS, BILITOT, ALBUMIN,  in the last 168 hours Cardiac Enzymes No results found for this basename: TROPONINI, PROBNP,  in the last 168 hours Glucose No results found for this basename: GLUCAP,  in the last 168 hours  Imaging Dg Chest 2 View  01/07/2013   CLINICAL DATA:  Preop for kyphoplasty  EXAM: CHEST  2 VIEW  COMPARISON:  11/17/2012 lumbar spine radiograph and 11/29/2012 MR thoracic spine  FINDINGS: Elevated left hemidiaphragm, obscures the retrocardiac space. Otherwise, no consolidation or pneumothorax. No right-sided pleural effusion. Limited vertebral body evaluation due to technique. High-riding humeral heads may reflect underlying rotator cuff pathology.  IMPRESSION: Elevated left hemidiaphragm. Otherwise, no acute process identified.   Electronically Signed   By: Jearld Lesch M.D.   On: 01/07/2013 06:51   Dg Thoracolumbar Spine  01/07/2013   CLINICAL DATA:  T11 and T12 kyphoplasty  EXAM: THORACOLUMBAR SPINE - 2 VIEW  COMPARISON:  11/29/2012  FINDINGS: There had been previous vertebral  augmentation at L1. Vertebral augmentation was performed at T11 and T12. There is chronic vertebra plana anteriorly at T12 with kyphotic curvature of the spine.  IMPRESSION: Newly seen vertebral augmentation at T11 and T12.   Electronically Signed   By: Paulina Fusi M.D.   On: 01/07/2013 09:28     CXR: clear, elevated right HD   ASSESSMENT / PLAN:  PULMONARY A:  No acute process Elevated right HD P:   Trend pulse ox  Wean FIO2 pulm hygiene   CARDIOVASCULAR A:  H/o afib (coumadin on hold) Hypotension. Not sure if this is residual sedation, hypovolemia, anti-hypertensive effect, or inadequate adrenal response. Cardiogenic origin also a consideration, but seems to be responding to fluids. The absence of tachycardia may be residual BB effect.  P:  Admit to ICU Tele Cycle CEs, ck BNP, ECHO and CXR Will go ahead and continue gentle fluid challenge if CXR clear.  Low dose neo for MAP >65   RENAL A:   CKD stage IV Mild metabolic acidosis  + AG(mild) and NG process.   P:   Ck lactate Repeat chemistry  Will consider bicarb supplementation   GASTROINTESTINAL A:   No acute issue, has h/o GERD P:   Cont ppi   HEMATOLOGIC A:   Mild anemia (h/o FESO4 def) RDW suggest still active process.  Mild coumadin induced coagulopathy  P:  Cont feso4 supplementation  Trend cbc PAS  INFECTIOUS A:   No active infection  P:   Trend fever curve Trend wbc  ENDOCRINE A:   No acute issue  P:   Ck glucose  Stress dose steroids  ssi w/ steroids   NEUROLOGIC A:   Thoracic 11 and 12 fractures, Lumbago, Kyphosis, osteoporosis, s/p T11-T12 Kyphoplasty (N/A) - T11-T12 Kyphoplasty 12/5 P:   Post op per neuro-surg PRN analgesia   TODAY'S SUMMARY: Post-op hypotension. CKD and concern for increased WOB (by nursing) however the pt denies. Will get CXR, r/o ischemia, hold antihypertensives, and place on low dose neo. Suspect that he can tolerate more fluid. Should  be able to wean off  pressors quickly.   I have personally obtained a history, examined the patient, evaluated laboratory and imaging results, formulated the assessment and plan and placed orders.  CRITICAL CARE: The patient is critically ill with multiple organ systems failure and requires high complexity decision making for assessment and support, frequent evaluation and titration of therapies, application of advanced monitoring technologies and extensive interpretation of multiple databases. Critical Care Time devoted to patient care services described in this note is 45 minutes.   Alyson Reedy, M.D. [facility] Pulmonary/Critical Care Medicine. Pager: [phone]. After hours pager: [phone].

## 2013-01-07 NOTE — Progress Notes (Signed)
CCM in to see pt,. Pt with wheezing now, stopped fluid bolus, Neo started , pt going to ICU, DR, Venetia Maxon to speak to family

## 2013-01-07 NOTE — Progress Notes (Signed)
Awake, alert, conversant.  MAEW with good strength.  No numbness.  Patient says his back pain is improved.  Blood pressure has been low.  Will observe.

## 2013-01-07 NOTE — Anesthesia Postprocedure Evaluation (Signed)
  Anesthesia Post-op Note  Patient: Christopher Marshall  Procedure(s) Performed: Procedure(s) with comments: T11-T12 Kyphoplasty (N/A) - T11-T12 Kyphoplasty  Patient Location: PACU  Anesthesia Type:General  Level of Consciousness: awake, alert  and oriented  Airway and Oxygen Therapy: Patient Spontanous Breathing  Post-op Pain: mild  Post-op Assessment: Post-op Vital signs reviewed, PATIENT'S CARDIOVASCULAR STATUS UNSTABLE, Respiratory Function Stable, Patent Airway, No signs of Nausea or vomiting and Pain level controlled  Post-op Vital Signs: Reviewed and unstable  Complications: No apparent anesthesia complications

## 2013-01-07 NOTE — Progress Notes (Signed)
PT Cancellation Note  Patient Details Name: BRALIN GARRY MRN: [id number] DOB: 1934/08/22   Cancelled Treatment:    Reason Eval/Treat Not Completed: Medical issues which prohibited therapy.  Pt with respiratory issues and transferring to ICU from PACU.  Will check on pt in am.  Thanks.   INGOLD,Sheena Donegan 01/07/2013, 12:59 PM  [facility] [phone] (pager)

## 2013-01-07 NOTE — H&P (Signed)
> [address] [place], [place] [id number] Phone: [phone]   Patient ID:   [id number] Patient: Christopher Marshall  Date of Birth: 1934-05-01 Visit Type: Office Visit   Date: 11/29/2012 02:00 PM Provider: Danae Orleans. Venetia Maxon    This 78 year old male presents for back pain.  HISTORY OF PRESENT ILLNESS: 1.  back pain   MRI for review on [facility] 2013 Bone Density results for review  T-spine listed under [place] L-spine listed under [facility] Sp[facility]  I reviewed Mr. Bauserman MRI which shows previous kyphoplasty of L1 with compression fractures of T11 and T12 levels with rolls of kyphosis at these levels without retropulsion of bone in the spinal canal.  Patient continues to complain of severe pain.  I have recommended based on his imaging studies and failure to improve with conservative management that he undergo T11 and T12 kyphoplasty procedure.     PAST MEDICAL/SURGICAL HISTORY  (Reviewed, updated)  Disease/disorder Onset Date Management Date Comments    Cataract extraction      Hernia repair      Hip replacement    Anxiety with depression      Cancer, unknown      GERD      Hypertension          PAST MEDICAL HISTORY, SURGICAL HISTORY, FAMILY HISTORY, SOCIAL HISTORY AND REVIEW OF SYSTEMS I have reviewed the patient's past medical, surgical, family and social history as well as the comprehensive review of systems as included on the [facility] history form dated, which I have signed.  Family History  (Reviewed, updated)  SOCIAL HISTORY  (Reviewed, updated) Preferred language is Unknown.         MEDICATIONS(added, continued or stopped this visit):   Medication Dose Prescribed Else Ind Started Stopped  allopurinol 100 mg tablet 100 mg Y    amlodipine 5 mg tablet 5 mg Y    Dilaudid 2 mg tablet 2 mg N 11/29/2012   finasteride 5 mg tablet 5 mg Y    Flexeril 5 mg tablet 5 mg N 11/17/2012   Flomax 0.4 mg  capsule 0.4 mg Y    hydrocodone 5 mg-acetaminophen 325 mg tablet 5 mg-325 mg N 10/21/2012   Klor-Con M20 mEq tablet,extended release 20 mEq Y    pantoprazole 40 mg tablet,delayed release 40 mg Y    Remeron 15 mg tablet 15 mg Y    warfarin 2.5 mg tablet 2.5 mg Y       ALLERGIES:  Ingredient Reaction Medication Name Comment  CEPHALEXIN MONOHYDRATE  KEFLEX   POTASSIUM CLAVULANATE  AUGMENTIN   CODEINE     AMOXICILLIN TRIHYDRATE  AUGMENTIN   TRIMETHOPRIM       Vitals Date Temp F BP Pulse Ht In Wt Lb BMI BSA Pain Score  11/29/2012  133/87 98 73 230 30.34  7/10      DIAGNOSTIC RESULTS As above    IMPRESSION Patient is continuing to have severe disabling pain following T11 and T12 compression fractures with previous kyphoplasty at L1.  He wishes to proceed with kyphoplasty procedure these new fractures.  He knows to stop his Coumadin for 5 days prior to surgery.  Assessment/Plan # Detail Type Description   1. Assessment Closed fracture of thoracic vertebra (805.2).       2. Assessment Closed lumbar fracture (805.4).       3. Assessment Lumbago (724.2).       4. Assessment Kyphosis of thoracolumbar region (  737.10).        Pain Assessment/Treatment Pain Scale: 7/10. Method: Numeric Pain Intensity Scale. Location: back. Pain Assessment/Treatment follow-up plan of care: patient currently takes pain medication for relief.  Plan is for patient to undergo T11 and T12 kyphoplasty procedure on 12/07/12.  Patient is aware of risks and benefits of surgery and wishes to proceed.  " Late entry due to failure to e-sign on date of appointment.  I attest that I reviewed the patient registration form on 11/29/12,  and am e-signing today.  This was brought to my attention due to internal audit."   Orders: Diagnostic Procedures: Assessment Procedure  805.2  - T11 - T12 kyphoplasty    MEDICATION PRESCRIBED TODAY    Rx Quantity Refills  DILAUDID 2 mg  60 0             Provider:  Danae Orleans. Venetia Maxon  12/05/2012 10:10 AM Dictation edited by: Danae Orleans. Venetia Maxon    CC Providers: Maeola Harman [address] [place], [place] [address] ----------------------------------------------------------------------------------------------------------------------------------------------------------------------         Electronically signed by Danae Orleans. Venetia Maxon on 12/05/2012 10:10 AM  > [address] [place], [place] [id number] Phone: [phone]   Patient ID:   [id number] Patient: Christopher Marshall  Date of Birth: Nov 30, 1934 Visit Type: Office Visit   Date: 11/17/2012 10:45 AM Provider: Danae Orleans. Venetia Maxon   Historian: self  This 78 year old male presents for back pain.  HISTORY OF PRESENT ILLNESS: 1.  back pain   Approx 1year post-Kyphoplasty.  Two weeks ago, pt lfted gas can into bed of his truck without lowering tailgate, causing back pain.  Pain increased over past two weeks left lower back, left buttock.   Norco did not help, so he resumed leftover Dilaudid 2mg  1-2/day after a discussion with his pharmacist.  Flexeril 5mg  0-1/day taken intermittently.  X-ray on [place].  Kyphoplasty was performed by Dr. Delane Ginger in [place].  Patient notes significant weakness in his left leg and has difficulty arising from a seated position.  He has considerable pain.  He is having to use a cane.  He is taking Dilaudid for pain.  On examination the patient has left hip flexor weakness at 4 minus out of 5.  Other muscles appear to have full strength.  Right leg is without problems.     PAST MEDICAL/SURGICAL HISTORY  (Detailed)  Disease/disorder Onset Date Management Date Comments    Cataract extraction      Hernia repair      Hip replacement    Anxiety with depression      Cancer, unknown      GERD      Hypertension          Family History  (Detailed) Patient reports there is no relevant family history.   SOCIAL HISTORY   (Detailed) Tobacco use reviewed. Preferred language is Unknown.   Smoking status: Never smoker.  SMOKING STATUS Use Status Type Smoking Status Usage Per Day Years Used Total Pack Years  no/never  Never smoker             Medications (added, continued or stopped this visit):   Medication Dose Prescribed Else Ind Started Stopped  allopurinol 100 mg tablet 100 mg Y    amlodipine 5 mg tablet 5 mg Y    Dilaudid 2 mg tablet 2 mg N 11/17/2012   finasteride 5 mg tablet 5 mg Y    Flexeril 5 mg tablet 5 mg N 11/17/2012  Flomax 0.4 mg capsule 0.4 mg Y    hydrocodone 5 mg-acetaminophen 325 mg tablet 5 mg-325 mg N 10/21/2012   Klor-Con M20 mEq tablet,extended release 20 mEq Y    pantoprazole 40 mg tablet,delayed release 40 mg Y    Remeron 15 mg tablet 15 mg Y    warfarin 2.5 mg tablet 2.5 mg Y       Allergies:  Ingredient Reaction Medication Name Comment  CEPHALEXIN MONOHYDRATE  KEFLEX   POTASSIUM CLAVULANATE  AUGMENTIN   CODEINE     AMOXICILLIN TRIHYDRATE  AUGMENTIN   TRIMETHOPRIM     New allergies added during this encounter. Active list above.   Vitals Date Temp F BP Pulse Ht In Wt Lb BMI BSA Pain Score  11/17/2012  114/76 118 72 209 28.35  10/10      DIAGNOSTIC RESULTS Radiographs today demonstrate compression fractures of T12 with kyphoplasty at L1 with retrolisthesis of L2 on L3 with foraminal stenosis at L2-L3 level.  This is a change from his previous radiographs.    IMPRESSION Patient has a significant left L2 radiculopathy with left hip flexor weakness.  He has considerable pain.  Completed Orders (this encounter) Order Details Reason Side Interpretation Result Initial Treatment Date Region  Lumbar Spine-AP/lateral          Assessment/Plan # Detail Type Description   1. Assessment Lumbago (724.2).   Plan Orders The patient had the following test(s) completed today Lumbar Spine-AP/lateral.       2. Assessment Lumbar radiculopathy (724.4).       3.  Assessment Closed lumbar fracture (805.4).       4. Assessment Herniated lumbar intervertebral disc (722.10).       5. Assessment Acquired spondylolisthesis (738.4).        Pain Assessment/Treatment Pain Scale: 10/10. Method: Numeric Pain Intensity Scale. Pain Assessment/Treatment follow-up plan of care: Pain management to be discussed by Dr. Venetia Maxon..  I have recommended that a thoracic and lumbar MRI be obtained to better clarify the extent of nerve compression and compression fractures.  He is to get his bone density test so that I may review it.  I gave him prescriptions for hydromorphone and Flexeril.  Orders: Diagnostic Procedures: Assessment Procedure  724.2 Lumbar Spine-AP/lateral  724.4 MRI Spine/lumb W/o Contrast    Medication Prescribed Today    Rx Quantity Refills  FLEXERIL 5 mg  90 3  DILAUDID 2 mg  60 0            Provider:  Danae Orleans. Venetia Maxon  11/17/2012 11:41 AM Dictation edited by: Danae Orleans. Venetia Maxon    CC Providers: Maeola Harman [address] [place], [place] [address] ----------------------------------------------------------------------------------------------------------------------------------------------------------------------         Electronically signed by Danae Orleans. Venetia Maxon on 11/18/2012 07:43 AM

## 2013-01-07 NOTE — Progress Notes (Addendum)
eLink Physician-Brief Progress Note Patient Name: Christopher Marshall DOB: 10/19/1934 MRN: [id number]  Date of Service  01/07/2013   HPI/Events of Note   Echo from 12/4 on the paper chart.  No need for 01/07/13 repeat echo  Normal LV size, moderate LVH EF  45-50% global HK  eICU Interventions  D/c order for 01/07/13 echo   Intervention Category Intermediate Interventions: Diagnostic test evaluation  Shan Levans 01/07/2013, 5:46 PM

## 2013-01-08 LAB — GLUCOSE, CAPILLARY
Glucose-Capillary: 115 mg/dL — ABNORMAL HIGH (ref 70–99)
Glucose-Capillary: 105 mg/dL — ABNORMAL HIGH (ref 70–99)
Glucose-Capillary: 131 mg/dL — ABNORMAL HIGH (ref 70–99)

## 2013-01-08 LAB — CBC
HCT: 30 % — ABNORMAL LOW (ref 39.0–52.0)
Hemoglobin: 9.9 g/dL — ABNORMAL LOW (ref 13.0–17.0)
MCV: 90.4 fL (ref 78.0–100.0)
Platelets: 227 10*3/uL (ref 150–400)
RBC: 3.32 MIL/uL — ABNORMAL LOW (ref 4.22–5.81)
WBC: 16.5 10*3/uL — ABNORMAL HIGH (ref 4.0–10.5)

## 2013-01-08 MED ORDER — HYDROMORPHONE HCL 2 MG PO TABS: 2.0000 mg | ORAL_TABLET | ORAL | Status: AC | PRN

## 2013-01-08 NOTE — Evaluation (Addendum)
Occupational Therapy Evaluation Patient Details Name: Christopher Marshall MRN: [id number] DOB: 10/28/1934 Today's Date: 01/08/2013 Time: 4098-1191 OT Time Calculation (min): 34 min  OT Assessment / Plan / Recommendation History of present illness 78 y.o. male admitted to [facility] on 01/07/13 for electve T11-12 kyphoplasty.  He had some pot-op hypotension and increased WOB that landed him in the neuro ICU post-op.     Clinical Impression   Pt overall min assist with functional mobility and more assist for LB ADL without AE. He has AE from previous surgeries and verbalizes understanding of how to use all. All education completed with pt and family. Pt may d/c later today. Reviewed technique for bed mobility.    OT Assessment  Patient does not need any further OT services    Follow Up Recommendations  No OT follow up;Supervision/Assistance - 24 hour    Barriers to Discharge      Equipment Recommendations  None recommended by OT (family will obtain tub DME on their own if desired)    Recommendations for Other Services    Frequency       Precautions / Restrictions Precautions Precautions: Fall Precaution Comments: pt has a colostomy LLQ. Reinforced back precautions for comfort Restrictions Weight Bearing Restrictions: No   Pertinent Vitals/Pain 2/10 back; reposition, rest    ADL  Eating/Feeding: Independent;Performed Where Assessed - Eating/Feeding: Chair Grooming: Performed;Wash/dry hands;Set up Where Assessed - Grooming: Supported sitting Upper Body Bathing: Simulated;Chest;Right arm;Left arm;Abdomen;Minimal assistance Where Assessed - Upper Body Bathing: Unsupported sitting Lower Body Bathing: Simulated;Moderate assistance Where Assessed - Lower Body Bathing: Supported sit to stand Upper Body Dressing: Simulated;Minimal assistance Where Assessed - Upper Body Dressing: Unsupported sitting Lower Body Dressing: Simulated;Maximal assistance Where Assessed - Lower Body Dressing:  Supported sit to stand Toilet Transfer: Performed;Minimal assistance Toilet Transfer Method: Sit to stand Toileting - Architect and Hygiene: Simulated;Min guard Where Assessed - Engineer, mining and Hygiene: Standing Equipment Used: Rolling walker;Long-handled shoe horn;Long-handled sponge;Reacher;Sock aid ADL Comments: Discussed back precautions to help with pain/comfort and pt has all AE from hip surgery in the past and is familiar with how to use all AE. wife and daughter able to help at d/c. Pt needs assist with sit to stand but does well once up on feet. Wife states she sometimes had to help give a boost for pt to stand at home. Discussed at length safety with DME and they plan to install a grab bar at the tub. Also discussed tubseat versus bench and advantages. Pt states he feels his leg will be stronger in a few days and he wont need a seat. The family verbalizes they will obtain a tubseat or bench if needed on their own. Also discussed using walker to step up to toilet and urinate (pt has colostomy) and use walker handles for support if needed.     OT Diagnosis:    OT Problem List:   OT Treatment Interventions:     OT Goals(Current goals can be found in the care plan section) Acute Rehab OT Goals Patient Stated Goal: to go home  Visit Information  Last OT Received On: 01/08/13 Assistance Needed: +1 History of Present Illness: 78 y.o. male admitted to [facility] on 01/07/13 for electve T11-12 kyphoplasty.  He had some pot-op hypotension and increased WOB that landed him in the neuro ICU post-op.         Prior Functioning     Home Living Family/patient expects to be discharged to:: Private residence Living Arrangements: Spouse/significant other;Children (  wife and daughter) Available Help at Discharge: Family;Available 24 hours/day Type of Home: House Home Access: Stairs to enter [employer] of Steps: 1 small Entrance Stairs-Rails: None Home  Layout: One level;Laundry or work area in basement (pt reports he does not go down into the basement.  ) Home Equipment: Walker - standard;Grab bars - tub/shower;Other (comment);Adaptive equipment (left chair) Adaptive Equipment: Reacher;Sock aid;Long-handled shoe horn;Long-handled sponge Prior Function Level of Independence: Independent with assistive device(s) Comments: per wife, usually uses "hurry cane" and has since he got hurt used the Std W.   Per pt and wife, he needed some assist with sit to stand at times. Communication Communication: No difficulties Dominant Hand: Right         Vision/Perception     Cognition  Cognition Arousal/Alertness: Awake/alert Behavior During Therapy: WFL for tasks assessed/performed Overall Cognitive Status: Within Functional Limits for tasks assessed    Extremity/Trunk Assessment Upper Extremity Assessment Upper Extremity Assessment: LUE deficits/detail LUE Deficits / Details: pt reports rotator cuff issues premorbid. very limited ROM and strength  Cervical / Trunk Assessment Cervical / Trunk Assessment: Kyphotic     Mobility Bed Mobility Bed Mobility: Not assessed (pt seated in recliner chair) Transfers Transfers: Sit to Stand;Stand to Sit Sit to Stand: 4: Min assist;With upper extremity assist;From chair/3-in-1 Stand to Sit: 4: Min guard;With upper extremity assist;To chair/3-in-1 Details for Transfer Assistance: verbal cues for hand placement. assist to rise and steady.     Exercise     Balance Balance Balance Assessed: Yes Static Standing Balance Static Standing - Level of Assistance: 5: Stand by assistance   End of Session OT - End of Session Equipment Utilized During Treatment: Rolling walker Activity Tolerance: Patient tolerated treatment well Patient left: in chair;with call bell/phone within reach;with family/visitor present  GO Functional Assessment Tool Used: clinical judgement Functional Limitation: Self care Self  Care Current Status (G9562): At least 40 percent but less than 60 percent impaired, limited or restricted Self Care Goal Status (Z3086): At least 40 percent but less than 60 percent impaired, limited or restricted Self Care Discharge Status [phone]): At least 40 percent but less than 60 percent impaired, limited or restricted   Judithann Sauger [phone] 01/08/2013, 1:45 PM

## 2013-01-08 NOTE — Progress Notes (Signed)
   CARE MANAGEMENT NOTE 01/08/2013  Patient:  SCHNEIDER, WARCHOL   Account Number:  [id number]  Date Initiated:  01/08/2013  Documentation initiated by:  [facility]  Subjective/Objective Assessment:   adm: T 11 and T 12 compression fractures with CAD, Renal failure, Atrial Fibrillation on chronic anticoagulation and Hypertension     Action/Plan:   discharge planning   Anticipated DC Date:  01/08/2013   Anticipated DC Plan:  HOME/SELF CARE      DC Planning Services  CM consult      Choice offered to / List presented to:     DME arranged  Levan Hurst      DME agency  [facility]        Status of service:   Medicare Important Message given?   (If response is "NO", the following Medicare IM given date fields will be blank) Date Medicare IM given:   Date Additional Medicare IM given:    Discharge Disposition:  HOME/SELF CARE  Per UR Regulation:    If discussed at Long Length of Stay Meetings, dates discussed:    Comments:  01/08/13 15:15 RN called to request delivery of Rolling Walker w/5" wheels.  DME delivery contacted to deliver RW prior to discharge.  NO PT/OT follow up recc.  No other CM needs were communicated.  Freddy Jaksch, BSN, CM [phone].

## 2013-01-08 NOTE — Progress Notes (Signed)
CSW received referral for New SNF placement.  CSW reviewed chart and noted that PT/OT recommend No PT/OT follow up and plan is for pt to return home.  Inappropriate CSW referral.  CSW signing off.   Please re-consult if social work needs arise.  Jacklynn Lewis, MSW, LCSWA (weekend coverage [phone]) Clinical Social Work

## 2013-01-08 NOTE — Discharge Summary (Signed)
Physician Discharge Summary  Patient ID: Christopher Marshall MRN: [id number] DOB/AGE: Jul 29, 1934 78 y.o.  Admit date: 01/07/2013 Discharge date: 01/08/2013  Admission Diagnoses:T 11 and T 12 compression fractures with CAD, Renal failure, Atrial Fibrillation on chronic anticoagulation and Hypertension  Discharge Diagnoses: T 11 and T 12 compression fractures with CAD, Renal failure, Atrial Fibrillation on chronic anticoagulation and Hypertension Active Problems:   Compression fracture of T12 vertebra   Hypotension, unspecified   Acute pulmonary edema   Hypoxemia   Discharged Condition: fair  Hospital Course: Uncomplicated T 11 and T 12 kyphoplasty procedures with postoperative hypotension requiring blood pressure support and ICU admission with monitoring and CCM consultation  Consults: pulmonary/intensive care  Significant Diagnostic Studies: None  Treatments: Blood pressure support and IV hydration  Discharge Exam: Blood pressure 116/79, pulse 99, temperature 97.4 F (36.3 C), temperature source Oral, resp. rate 17, height 6\' 1"  (1.854 m), weight 112.1 kg (247 lb 2.2 oz), SpO2 99.00%. Neurologic: Alert and oriented X 3, normal strength and tone. Normal symmetric reflexes. Normal coordination and gait Wound: CDI  Disposition: Home     Medication List         allopurinol 100 MG tablet  Commonly known as:  ZYLOPRIM  Take 100 mg by mouth daily.     amLODipine 5 MG tablet  Commonly known as:  NORVASC  Take 5 mg by mouth daily.     cholecalciferol 1000 UNITS tablet  Commonly known as:  VITAMIN D  Take 1,000 Units by mouth daily.     cyclobenzaprine 5 MG tablet  Commonly known as:  FLEXERIL  Take 5 mg by mouth 3 (three) times daily as needed for muscle spasms.     docusate sodium 100 MG capsule  Commonly known as:  COLACE  Take 100 mg by mouth 2 (two) times daily as needed for constipation.     esomeprazole 40 MG capsule  Commonly known as:  NEXIUM  Take 40 mg by  mouth daily before breakfast.     ferrous gluconate 324 MG tablet  Commonly known as:  FERGON  Take 324 mg by mouth daily with breakfast.     finasteride 5 MG tablet  Commonly known as:  PROSCAR  Take 5 mg by mouth daily.     HYDROmorphone 2 MG tablet  Commonly known as:  DILAUDID  Take 1 tablet (2 mg total) by mouth every 4 (four) hours as needed for moderate pain or severe pain.     hydroxypropyl methylcellulose 2.5 % ophthalmic solution  Commonly known as:  ISOPTO TEARS  Place 1 drop into both eyes 4 (four) times daily as needed (dry eyes).     metoprolol tartrate 25 MG tablet  Commonly known as:  LOPRESSOR  Take 25 mg by mouth 2 (two) times daily.     mirtazapine 15 MG tablet  Commonly known as:  REMERON  Take 15 mg by mouth at bedtime.     pantoprazole 40 MG tablet  Commonly known as:  PROTONIX  Take 40 mg by mouth daily.     potassium chloride SA 20 MEQ tablet  Commonly known as:  K-DUR,KLOR-CON  Take 20 mEq by mouth daily.     predniSONE 10 MG tablet  Commonly known as:  DELTASONE  Take 12.5 mg by mouth daily. Weaning     tamsulosin 0.4 MG Caps capsule  Commonly known as:  FLOMAX  Take 0.4 mg by mouth at bedtime.     warfarin 2 MG tablet  Commonly known as:  COUMADIN  Take 1-2 mg by mouth daily. 2 mg alternating with 1 mg daily         Signed: Dorian Heckle, MD 01/08/2013, 3:20 PM

## 2013-01-08 NOTE — Evaluation (Signed)
Physical Therapy Evaluation Patient Details Name: Christopher Marshall MRN: [id number] DOB: 1934/12/30 Today's Date: 01/08/2013 Time: 7829-5621 PT Time Calculation (min): 24 min  PT Assessment / Plan / Recommendation History of Present Illness  78 y.o. male admitted to [facility] on 01/07/13 for electve T11-12 kyphoplasty.  He had some pot-op hypotension and increased WOB that landed him in the neuro ICU post-op.    Clinical Impression  Pt is moving well with PT, but would benefit from using a Rw for a short period of time until he feels better and can transition back to a cane.  He has a Std W at home and I contacted Dr. Venetia Maxon and care management to order a RW.      PT Assessment  Patent does not need any further PT services    Follow Up Recommendations  No PT follow up;Supervision for mobility/OOB    Does the patient have the potential to tolerate intense rehabilitation     NA  Barriers to Discharge   None- has excellent family support at home.        Equipment Recommendations  Rolling walker with 5" wheels    Recommendations for Other Services   None  Frequency   NA- one time eval and d/c   Precautions / Restrictions Precautions Precautions: Fall Precaution Comments: pt has a colostomy LLQ Restrictions Weight Bearing Restrictions: No   Pertinent Vitals/Pain O2 sats on RA 99%.       Mobility  Bed Mobility Bed Mobility: Not assessed (pt seated in recliner chair) Transfers Transfers: Sit to Stand;Stand to Sit Sit to Stand: 3: Mod assist;Without upper extremity assist;With armrests;From chair/3-in-1 Stand to Sit: 5: Supervision;With upper extremity assist;With armrests;To chair/3-in-1 Details for Transfer Assistance: mod assist to get out of low recliner chair pushing pt up from his behnid (per wife the first time he broke his thoracic spine someone was pulling on his arms to get him up out of a chair).  He has a lift chair at home.   Ambulation/Gait Ambulation/Gait Assistance: 5:  Supervision Ambulation Distance (Feet): 120 Feet Assistive device: Rolling walker Ambulation/Gait Assistance Details: flexed posture (pre morbid), shuffling gait, but no signs of instability if he uses the RW.  Per pt he would prefer RW to Std W he uses at home.   Gait Pattern: Step-through pattern;Shuffle Gait velocity: decreased        PT Goals(Current goals can be found in the care plan section) Acute Rehab PT Goals Patient Stated Goal: to go home PT Goal Formulation: No goals set, d/c therapy  Visit Information  Last PT Received On: 01/08/13 Assistance Needed: +1 History of Present Illness: 78 y.o. male admitted to [facility] on 01/07/13 for electve T11-12 kyphoplasty.  He had some pot-op hypotension and increased WOB that landed him in the neuro ICU post-op.         Prior Functioning  Home Living Family/patient expects to be discharged to:: Private residence Living Arrangements: Spouse/significant other;Children (wife and daughter) Available Help at Discharge: Family;Available 24 hours/day Type of Home: House Home Access: Stairs to enter [employer] of Steps: 1 small Entrance Stairs-Rails: None Home Layout: One level;Laundry or work area in basement (pt reports he does not go down into the basement.  ) Home Equipment: Walker - standard;Grab bars - tub/shower;Other (comment) (left chair) Prior Function Level of Independence: Independent with assistive device(s) Comments: per wife, usually uses "hurry cane" and has since he got hurt used the Std W.   Communication Communication: No difficulties  Dominant Hand: Right    Cognition  Cognition Arousal/Alertness: Awake/alert Behavior During Therapy: WFL for tasks assessed/performed Overall Cognitive Status: Within Functional Limits for tasks assessed    Extremity/Trunk Assessment Upper Extremity Assessment Upper Extremity Assessment: Defer to OT evaluation Lower Extremity Assessment Lower Extremity Assessment:  Generalized weakness Cervical / Trunk Assessment Cervical / Trunk Assessment: Kyphotic   Balance    End of Session PT - End of Session Equipment Utilized During Treatment: Gait belt Activity Tolerance: Patient tolerated treatment well Patient left: in chair;with call bell/phone within reach;with family/visitor present Nurse Communication: Mobility status;Other (comment) (needs RW for d/c)  GP Functional Assessment Tool Used: assist levels Functional Limitation: Mobility: Walking and moving around Mobility: Walking and Moving Around Current Status [phone]): At least 20 percent but less than 40 percent impaired, limited or restricted Mobility: Walking and Moving Around Goal Status [phone]): At least 1 percent but less than 20 percent impaired, limited or restricted Mobility: Walking and Moving Around Discharge Status [phone]): At least 1 percent but less than 20 percent impaired, limited or restricted   Christopher Marshall, PT, DPT [phone]   01/08/2013, 12:30 PM

## 2013-01-08 NOTE — Progress Notes (Signed)
Subjective: Patient reports feels much better  Objective: Vital signs in last 24 hours: Temp:  [95.9 F (35.5 C)-97.6 F (36.4 C)] 96.7 F (35.9 C) (12/06 0400) Pulse Rate:  [48-154] 84 (12/06 0700) Resp:  [14-26] 14 (12/06 0700) BP: (73-124)/(41-95) 122/89 mmHg (12/06 0700) SpO2:  [92 %-98 %] 96 % (12/06 0000) Weight:  [104.9 kg (231 lb 4.2 oz)-112.1 kg (247 lb 2.2 oz)] 112.1 kg (247 lb 2.2 oz) (12/06 0530)  Intake/Output from previous day: 12/05 0701 - 12/06 0700 In: 3323.9 [P.O.:360; I.V.:2213.9; IV Piggyback:750] Out: 600 [Urine:600] Intake/Output this shift:    Physical Exam: MAEW.  Back pain improved.  BP stable in 120 s.  Lab Results:  Recent Labs  01/07/13 0620 01/08/13 0334  WBC 18.5* 16.5*  HGB 11.7* 9.9*  HCT 35.7* 30.0*  PLT 232 227   BMET  Recent Labs  01/07/13 0620  NA 135  K 4.2  CL 102  CO2 18*  GLUCOSE 106*  BUN 58*  CREATININE 3.30*  CALCIUM 8.6    Studies/Results: Dg Chest 2 View  01/07/2013   CLINICAL DATA:  Preop for kyphoplasty  EXAM: CHEST  2 VIEW  COMPARISON:  11/17/2012 lumbar spine radiograph and 11/29/2012 MR thoracic spine  FINDINGS: Elevated left hemidiaphragm, obscures the retrocardiac space. Otherwise, no consolidation or pneumothorax. No right-sided pleural effusion. Limited vertebral body evaluation due to technique. High-riding humeral heads may reflect underlying rotator cuff pathology.  IMPRESSION: Elevated left hemidiaphragm. Otherwise, no acute process identified.   Electronically Signed   By: Jearld Lesch M.D.   On: 01/07/2013 06:51   Dg Thoracolumbar Spine  01/07/2013   CLINICAL DATA:  T11 and T12 kyphoplasty  EXAM: THORACOLUMBAR SPINE - 2 VIEW  COMPARISON:  11/29/2012  FINDINGS: There had been previous vertebral augmentation at L1. Vertebral augmentation was performed at T11 and T12. There is chronic vertebra plana anteriorly at T12 with kyphotic curvature of the spine.  IMPRESSION: Newly seen vertebral augmentation  at T11 and T12.   Electronically Signed   By: Paulina Fusi M.D.   On: 01/07/2013 09:28   Dg Chest Port 1 View  01/07/2013   CLINICAL DATA:  Pulmonary edema .  EXAM: PORTABLE CHEST - 1 VIEW  COMPARISON:  01/07/2013.  FINDINGS: Cardiomegaly with pulmonary vascular prominence and interstitial prominence present consistent with congestive heart failure and pulmonary edema. Small pleural effusion cannot be excluded. Stable elevation left hemidiaphragm is present. No pneumothorax. Degenerative changes both shoulders.  IMPRESSION: Findings consistent with congestive heart failure and interstitial edema.   Electronically Signed   By: Maisie Fus  Register   On: 01/07/2013 11:13    Assessment/Plan: Patient doing well.  BP better.  OK to D/C from my standpoint if OK with CCM.    LOS: 1 day    Dorian Heckle, MD 01/08/2013, 9:43 AM

## 2013-01-18 ENCOUNTER — Encounter (HOSPITAL_COMMUNITY): Payer: Self-pay | Admitting: Neurosurgery

## 2013-02-23 NOTE — Progress Notes (Signed)
Late entry for incorrect G-code.    01-30-13 1211  PT G-Codes **NOT FOR INPATIENT CLASS**  Functional Assessment Tool Used assist levels  Functional Limitation Mobility: Walking and moving around  Mobility: Walking and Moving Around Current Status (Z6010) CI  Mobility: Walking and Moving Around Goal Status [phone]) CI  Mobility: Walking and Moving Around Discharge Status [phone]) CI  Lavonia Dana, [place]  [phone] 02/23/2013

## 2013-04-13 ENCOUNTER — Other Ambulatory Visit: Payer: Self-pay | Admitting: Neurosurgery

## 2013-04-18 ENCOUNTER — Encounter (HOSPITAL_COMMUNITY): Payer: Self-pay | Admitting: Pharmacy Technician

## 2013-04-20 ENCOUNTER — Other Ambulatory Visit (HOSPITAL_COMMUNITY): Payer: Self-pay | Admitting: *Deleted

## 2013-04-20 NOTE — Pre-Procedure Instructions (Signed)
Christopher Marshall  04/20/2013   Your procedure is scheduled on:  Tuesday, April 26, 2013 at 2:10 PM.   Report to [facility] Entrance "A" Admitting Office at 11:10 AM.   Call this number if you have problems the morning of surgery: [phone]   Remember:   Do not eat food or drink liquids after midnight Monday, 04/25/13.   Take these medicines the morning of surgery with A SIP OF WATER: allopurinol (ZYLOPRIM),  diltiazem (CARDIZEM CD), finasteride (PROSCAR), levothyroxine (SYNTHROID, LEVOTHROID), pantoprazole (PROTONIX), predniSONE (DELTASONE)   Stop Xarelto as of today 5 days prior to surgery 04/21/13.   Do not wear jewelry.  Do not wear lotions, powders, or cologne. You may wear deodorant.             Men may shave face and neck.  Do not bring valuables to the hospital.  [facility] is not responsible for any belongings or valuables.               Contacts, dentures or bridgework may not be worn into surgery.  Leave suitcase in the car. After surgery it may be brought to your room.  For patients admitted to the hospital, discharge time is determined by your  treatment team.              Special Instructions: [facility] - Preparing for Surgery  Before surgery, you can play an important role.  Because skin is not sterile, your skin needs to be as free of germs as possible.  You can reduce the number of germs on you skin by washing with CHG (chlorahexidine gluconate) soap before surgery.  CHG is an antiseptic cleaner which kills germs and bonds with the skin to continue killing germs even after washing.  Please DO NOT use if you have an allergy to CHG or antibacterial soaps.  If your skin becomes reddened/irritated stop using the CHG and inform your nurse when you arrive at Short Stay.  Do not shave (including legs and underarms) for at least 48 hours prior to the first CHG shower.  You may shave your face.  Please follow these instructions carefully:   1.  Shower with CHG  Soap the night before surgery and the                                morning of Surgery.  2.  If you choose to wash your hair, wash your hair first as usual with your       normal shampoo.  3.  After you shampoo, rinse your hair and body thoroughly to remove the                      Shampoo.  4.  Use CHG as you would any other liquid soap.  You can apply chg directly       to the skin and wash gently with scrungie or a clean washcloth.  5.  Apply the CHG Soap to your body ONLY FROM THE NECK DOWN.        Do not use on open wounds or open sores.  Avoid contact with your eyes, ears, mouth and genitals (private parts).  Wash genitals (private parts) with your normal soap.  6.  Wash thoroughly, paying special attention to the area where your surgery        will be performed.  7.  Thoroughly rinse your  body with warm water from the neck down.  8.  DO NOT shower/wash with your normal soap after using and rinsing off       the CHG Soap.  9.  Pat yourself dry with a clean towel.            10.  Wear clean pajamas.            11.  Place clean sheets on your bed the night of your first shower and do not        sleep with pets.  Day of Surgery  Do not apply any lotions the morning of surgery.  Please wear clean clothes to the hospital/surgery center.     Please read over the following fact sheets that you were given: Pain Booklet, Coughing and Deep Breathing, MRSA Information and Surgical Site Infection Prevention

## 2013-04-21 ENCOUNTER — Encounter (HOSPITAL_COMMUNITY): Payer: Self-pay

## 2013-04-21 ENCOUNTER — Encounter (HOSPITAL_COMMUNITY)
Admission: RE | Admit: 2013-04-21 | Discharge: 2013-04-21 | Disposition: A | Discharge status: Home / Self Care | Payer: Medicare Other | Source: Ambulatory Visit | Attending: Neurosurgery | Admitting: Neurosurgery

## 2013-04-21 DIAGNOSIS — Z01812 Encounter for preprocedural laboratory examination: Secondary | ICD-10-CM | POA: Insufficient documentation

## 2013-04-21 HISTORY — DX: Adverse effect of unspecified anesthetic, initial encounter: T41.45XA

## 2013-04-21 HISTORY — DX: Other complications of anesthesia, initial encounter: T88.59XA

## 2013-04-21 LAB — CBC
HEMATOCRIT: 33.9 % — AB (ref 39.0–52.0)
HEMOGLOBIN: 10.9 g/dL — AB (ref 13.0–17.0)
MCH: 29.9 pg (ref 26.0–34.0)
MCHC: 32.2 g/dL (ref 30.0–36.0)
MCV: 93.1 fL (ref 78.0–100.0)
Platelets: 220 10*3/uL (ref 150–400)
RBC: 3.64 MIL/uL — ABNORMAL LOW (ref 4.22–5.81)
RDW: 15.8 % — AB (ref 11.5–15.5)
WBC: 13.5 10*3/uL — ABNORMAL HIGH (ref 4.0–10.5)

## 2013-04-21 LAB — SURGICAL PCR SCREEN
MRSA, PCR: NEGATIVE
Staphylococcus aureus: NEGATIVE

## 2013-04-21 LAB — BASIC METABOLIC PANEL
BUN: 41 mg/dL — AB (ref 6–23)
CHLORIDE: 98 meq/L (ref 96–112)
CO2: 26 mEq/L (ref 19–32)
CREATININE: 2.85 mg/dL — AB (ref 0.50–1.35)
Calcium: 9.4 mg/dL (ref 8.4–10.5)
GFR calc Af Amer: 23 mL/min — ABNORMAL LOW (ref >90)
GFR calc non Af Amer: 20 mL/min — ABNORMAL LOW (ref >90)
Glucose, Bld: 136 mg/dL — ABNORMAL HIGH (ref 70–99)
Potassium: 4.6 mEq/L (ref 3.7–5.3)
Sodium: 138 mEq/L (ref 137–147)

## 2013-04-21 LAB — PROTIME-INR
INR: 2.26 — AB (ref 0.00–1.49)
Prothrombin Time: 24.2 seconds — ABNORMAL HIGH (ref 11.6–15.2)

## 2013-04-21 LAB — APTT: aPTT: 38 seconds — ABNORMAL HIGH (ref 24–37)

## 2013-04-21 NOTE — H&P (Signed)
[facility] [place], [place] [address] Phone: [phone]   Patient ID:   [id number] Patient: Christopher Marshall  Date of Birth: November 13, 1934 Visit Type: Office Visit   Date: 04/13/2013 11:45 AM Provider: Marchia Meiers. Vertell Limber MD   This 79 year old male presents for Follow Up of back pain.  History of Present Illness: 1.  Follow Up of back pain  Christopher Marshall returns to review MRI & X-rays  Imaging on [facility]  New MRI demonstrates significant compression fracture T8 which I believe this the basis for his severe back pain.  He is not complaining of lower extremity symptoms.  There is some retropulsion of bone in the spinal canal, but he does not appear to have significant spinal cord symptoms.  He wants to get some relief from what he describes as agonizing and incapacitating pain.  At the time of his last surgical procedure he had a decreased blood pressure which required ICU observation overnight.  I would like for the patient see Dr. Nancie Neas, his cardiologist, prior to surgery to optimize cardiac function.        PAST MEDICAL/SURGICAL HISTORY   (Reviewed, updated)  Disease/disorder Onset Date Management Date Comments    Cataract extraction      Hernia repair      Hip replacement    Anxiety with depression      Cancer, unknown      GERD      Hypertension       DIAGNOSTICS HISTORY: Test Ordered Interpretation Result completed  Lumbar Spine-AP/lateral 11/17/2012   11/17/2012  Thoraco-lumbar Spine- AP/Lat 01/26/2013   01/26/2013   Test Ordered Ordering Comments Modifier  Lumbar Spine-AP/lateral 11/17/2012    Thoraco-lumbar Spine- AP/Lat 01/26/2013 center on T11-T12 please      PAST MEDICAL HISTORY, SURGICAL HISTORY, FAMILY HISTORY, SOCIAL HISTORY AND REVIEW OF SYSTEMS I have reviewed the patient's past medical, surgical, family and social history as well as the comprehensive review of systems as included on the [facility] history  form dated, which I have signed.  Family History  (Reviewed, updated) Patient reports there is no relevant family history.   SOCIAL HISTORY  (Reviewed, updated) Preferred language is Unknown.   Smoking status: Never smoker.         MEDICATIONS(added, continued or stopped this visit):   Started Medication Directions Instruction Stopped   allopurinol 100 mg tablet take 1 tablet by oral route  every day     amlodipine 5 mg tablet take 1 tablet by oral route  every day    12/27/2012 Dilaudid 2 mg tablet take 1 tablet by oral route  every 4 - 6 hours as needed  04/13/2013  04/13/2013 Dilaudid 2 mg tablet take 1 tablet by oral route  every 4 - 6 hours as needed     finasteride 5 mg tablet take 1 tablet by oral route  every day    11/17/2012 Flexeril 5 mg tablet take 1 tablet by oral route 3 times every day     Flomax 0.4 mg capsule take 1 capsule by oral route  every day 1/2 hour following the same meal each day    10/21/2012 hydrocodone 5 mg-acetaminophen 325 mg tablet take 1  tablet by oral route  every 12 hours as needed for pain     Klor-Con M20 mEq tablet,extended release take 2 tablet by oral route 2 times every day with food     pantoprazole 40 mg tablet,delayed release take 1  tablet by oral route  every day     prednisone 5 mg tablet 2 tablets daily     Remeron 15 mg tablet take 1 tablet by oral route  every day before bedtime     warfarin 2.5 mg tablet take 1 tablet by oral route 2 times every day      ALLERGIES:  Ingredient Reaction Medication Name Comment  CEPHALEXIN MONOHYDRATE  KEFLEX   POTASSIUM CLAVULANATE  AUGMENTIN   CODEINE     AMOXICILLIN TRIHYDRATE  AUGMENTIN   TRIMETHOPRIM     Reviewed, no changes.   Vitals Date Temp F BP Pulse Ht In Wt Lb BMI BSA Pain Score  04/13/2013  120/73 114 73 235 31  4/10      DIAGNOSTIC RESULTS Plain radiographs demonstrated good 50% height loss of the T8 vertebral body with stable compression fractures and prior  arthroplasties T11-T12 and L1 levels.    IMPRESSION New and symptomatic compression fracture of T8 with severe back pain  Completed Orders (this encounter) Order Details Reason Side Interpretation Result Initial Treatment Date Region  Lifestyle education regarding diet f/u with pcp        Thoracic Spine- AP/Lat      04/13/2013    Assessment/Plan # Detail Type Description   1. Assessment BMI 31.0-31.9,ADULT (V85.31).   Plan Orders Today's instructions / counseling include(s) Lifestyle education regarding diet.       2. Assessment Closed fracture of thoracic vertebra (805.2).       3. Assessment Kyphosis of thoracolumbar region (737.10).       4. Assessment Lumbago (724.2).       5. Assessment Thoracic back pain (724.1).         Pain Assessment/Treatment Pain Scale: 4/10. Method: Numeric Pain Intensity Scale. Location: back. Onset: 12/04/2012. Duration: varies. Quality: aching, burning. Pain Assessment/Treatment follow-up plan of care: Patient currently taking pain medication as needed..  Plan is for kyphoplasty of T8, tentatively scheduled for 04/26/13.  Orders: Diagnostic Procedures: Assessment Procedure  805.2 Kyphoplasty - T8  805.2 Thoracic Spine- AP/Lat  Instruction(s)/Education: Assessment Instruction  V85.31 Lifestyle education regarding diet    MEDICATIONS PRESCRIBED TODAY    Rx Quantity Refills  DILAUDID 2 mg  60 0            Provider:  Marchia Meiers. Vertell Limber MD  04/16/2013 06:06 PM Dictation edited by: Marchia Meiers. Vertell Limber    CC Providers: Erline Levine MD [address] [place], [place] [address]  ----------------------------------------------------------------------------------------------------------------------------------------------------------------------         Electronically signed by Marchia Meiers. Vertell Limber MD on 04/16/2013 06:06 PM

## 2013-04-22 NOTE — Progress Notes (Signed)
Anesthesia Chart Review: Patient is a 79 year old male scheduled for T8 kyphoplasty on 04/26/13 by Dr. Vertell Limber. He is s/p T11 and T12 kyphoplasty on 01/07/14.  I reviewed his chart prior to that procedure.  Anesthesiology requested that he be cleared by nephrology prior to that procedure which he was (see my notes from 12/06/12 and 01/06/13.)   History includes non-smoker, PAD, GERD, BLE DVT, CKD stage IV, anxiety, depression, afib. bilateral IHR, perforated diverticulitis s/p colon resection with colostomy, psoriatic and osteoarthritis, gout, skin cancer (BCC), right THR, bilateral IHR, family history of post-operative N/V, left rotator cuff injury. He was seen by [facility] 01/2013 for post-operative hypotension and pulmonary edema, but did not require re-intubation. PCP is listed as Dr. Darlina Sicilian in [place]. Urologist is Dr. Marlou Sa Clower with reported PRN follow-up recommended in 07/2012. Nephrologist Dr. Moreen Fowler of [facility]. Cardiologist is Dr. Donn Pierini who cleared patient with low cardiac risk with permission to hold Xarelto 5 days prior to surgery, last dose 04/20/13.  EKG on 01/07/14 showed afib at 83 bpm. HR was 86 bpm at PAT.  Previous cardiology and nephrology records from his hospitalization in 01/2013 can be found under the Media tab and include: - Echo on 01/06/13 showed normal LV size with moderate symmetric LVH, mild global hypokinesis, EF 45-50%, mitral inflow pattern is uniphasic due to afib, normal pulmonary artery pressure, mild AI, MR, TR, PI.   - Nuclear stress test on 01/05/13 showed normal LVEDV, no TID. Small in size, moderate in severity fixed inferoapical wall defect consistent with attenuation artifact, no ischemia. Grossly normal LV function and wall motion by visual estimation. EF by computer was 39%, but this is likely due to gating artifact from afib.   His post-operative 1V CXR on 01/07/13 showed CHF and interstitial edema.  Will repeat CXR on  arrival for more recent baseline.  Preoperative labs noted. Na 138, K 4.6, BUN 41, Cr 2.85, glucose 136. WBC 13.5K. He is on chronic steroids. PT/INR 24.2/2.26, PTT 38. His renal function appears within his baseline. Plan to recheck PT/PTT on arrival.   Further evaluation by his assigned anesthesiologist on the day of surgery to ensure no acute cardiopulmonary issues.  He was already cleared by his cardiologist and renal function appears stable.  If no new changes and follow-up labs acceptable then I would anticipate that he could proceed as planned.  George Hugh [facility]/Anesthesiology Phone [phone] 04/22/2013 1:29 PM

## 2013-04-25 MED ORDER — VANCOMYCIN HCL 10 G IV SOLR
1500.0000 mg | INTRAVENOUS | Status: AC
  Administered 2013-04-26: 1500 mg via INTRAVENOUS
  Filled 2013-04-25: qty 1500

## 2013-04-26 ENCOUNTER — Ambulatory Visit (HOSPITAL_COMMUNITY)
Admission: RE | Admit: 2013-04-26 | Discharge: 2013-04-26 | Disposition: A | Discharge status: Home / Self Care | Payer: Medicare Other | Source: Ambulatory Visit | Attending: Neurosurgery | Admitting: Neurosurgery

## 2013-04-26 ENCOUNTER — Inpatient Hospital Stay (HOSPITAL_COMMUNITY): Payer: Medicare Other | Admitting: Anesthesiology

## 2013-04-26 ENCOUNTER — Inpatient Hospital Stay (HOSPITAL_COMMUNITY): Payer: Medicare Other

## 2013-04-26 ENCOUNTER — Encounter (HOSPITAL_COMMUNITY): Payer: Medicare Other | Admitting: Vascular Surgery

## 2013-04-26 ENCOUNTER — Encounter (HOSPITAL_COMMUNITY)
Admission: RE | Disposition: A | Discharge status: Home / Self Care | Payer: Self-pay | Source: Ambulatory Visit | Attending: Neurosurgery

## 2013-04-26 ENCOUNTER — Encounter (HOSPITAL_COMMUNITY): Payer: Self-pay | Admitting: *Deleted

## 2013-04-26 DIAGNOSIS — M4000 Postural kyphosis, site unspecified: Secondary | ICD-10-CM | POA: Insufficient documentation

## 2013-04-26 DIAGNOSIS — I509 Heart failure, unspecified: Secondary | ICD-10-CM | POA: Insufficient documentation

## 2013-04-26 DIAGNOSIS — X58XXXA Exposure to other specified factors, initial encounter: Secondary | ICD-10-CM | POA: Insufficient documentation

## 2013-04-26 DIAGNOSIS — F329 Major depressive disorder, single episode, unspecified: Secondary | ICD-10-CM | POA: Insufficient documentation

## 2013-04-26 DIAGNOSIS — S22009A Unspecified fracture of unspecified thoracic vertebra, initial encounter for closed fracture: Secondary | ICD-10-CM | POA: Diagnosis not present

## 2013-04-26 DIAGNOSIS — I4891 Unspecified atrial fibrillation: Secondary | ICD-10-CM | POA: Insufficient documentation

## 2013-04-26 DIAGNOSIS — F411 Generalized anxiety disorder: Secondary | ICD-10-CM | POA: Insufficient documentation

## 2013-04-26 DIAGNOSIS — Z9049 Acquired absence of other specified parts of digestive tract: Secondary | ICD-10-CM | POA: Insufficient documentation

## 2013-04-26 DIAGNOSIS — F3289 Other specified depressive episodes: Secondary | ICD-10-CM | POA: Insufficient documentation

## 2013-04-26 DIAGNOSIS — Z86718 Personal history of other venous thrombosis and embolism: Secondary | ICD-10-CM | POA: Insufficient documentation

## 2013-04-26 DIAGNOSIS — K219 Gastro-esophageal reflux disease without esophagitis: Secondary | ICD-10-CM | POA: Insufficient documentation

## 2013-04-26 DIAGNOSIS — I739 Peripheral vascular disease, unspecified: Secondary | ICD-10-CM | POA: Insufficient documentation

## 2013-04-26 DIAGNOSIS — N289 Disorder of kidney and ureter, unspecified: Secondary | ICD-10-CM | POA: Insufficient documentation

## 2013-04-26 DIAGNOSIS — M1990 Unspecified osteoarthritis, unspecified site: Secondary | ICD-10-CM | POA: Insufficient documentation

## 2013-04-26 DIAGNOSIS — I10 Essential (primary) hypertension: Secondary | ICD-10-CM | POA: Insufficient documentation

## 2013-04-26 DIAGNOSIS — Z7901 Long term (current) use of anticoagulants: Secondary | ICD-10-CM | POA: Insufficient documentation

## 2013-04-26 DIAGNOSIS — Z933 Colostomy status: Secondary | ICD-10-CM | POA: Insufficient documentation

## 2013-04-26 DIAGNOSIS — D649 Anemia, unspecified: Secondary | ICD-10-CM | POA: Insufficient documentation

## 2013-04-26 HISTORY — PX: KYPHOPLASTY: SHX5884

## 2013-04-26 LAB — APTT: aPTT: 29 seconds (ref 24–37)

## 2013-04-26 LAB — PROTIME-INR
INR: 1.14 (ref 0.00–1.49)
Prothrombin Time: 14.4 seconds (ref 11.6–15.2)

## 2013-04-26 SURGERY — KYPHOPLASTY
Anesthesia: Monitor Anesthesia Care

## 2013-04-26 MED ORDER — FENTANYL CITRATE 0.05 MG/ML IJ SOLN
INTRAMUSCULAR | Status: AC
  Filled 2013-04-26: qty 5

## 2013-04-26 MED ORDER — ONDANSETRON HCL 4 MG/2ML IJ SOLN: 4.0000 mg | Freq: Once | INTRAMUSCULAR | Status: DC | PRN

## 2013-04-26 MED ORDER — SODIUM CHLORIDE 0.9 % IV SOLN
INTRAVENOUS | Status: DC
  Administered 2013-04-26: 12:00:00 via INTRAVENOUS

## 2013-04-26 MED ORDER — FENTANYL CITRATE 0.05 MG/ML IJ SOLN: 25.0000 ug | INTRAMUSCULAR | Status: DC | PRN

## 2013-04-26 MED ORDER — PROPOFOL INFUSION 10 MG/ML OPTIME
INTRAVENOUS | Status: DC | PRN
  Administered 2013-04-26: 75 ug/kg/min via INTRAVENOUS

## 2013-04-26 MED ORDER — BUPIVACAINE HCL (PF) 0.5 % IJ SOLN
INTRAMUSCULAR | Status: DC | PRN
  Administered 2013-04-26: 6.5 mL

## 2013-04-26 MED ORDER — LIDOCAINE-EPINEPHRINE 1 %-1:100000 IJ SOLN
INTRAMUSCULAR | Status: DC | PRN
  Administered 2013-04-26: 6.5 mL via INTRADERMAL

## 2013-04-26 MED ORDER — PROPOFOL 10 MG/ML IV BOLUS
INTRAVENOUS | Status: AC
  Filled 2013-04-26: qty 20

## 2013-04-26 MED ORDER — FENTANYL CITRATE 0.05 MG/ML IJ SOLN
INTRAMUSCULAR | Status: DC | PRN
  Administered 2013-04-26 (×10): 25 ug via INTRAVENOUS

## 2013-04-26 MED ORDER — IOHEXOL 300 MG/ML  SOLN
INTRAMUSCULAR | Status: DC | PRN
  Administered 2013-04-26: 50 mL

## 2013-04-26 MED ORDER — 0.9 % SODIUM CHLORIDE (POUR BTL) OPTIME
TOPICAL | Status: DC | PRN
  Administered 2013-04-26: 1000 mL

## 2013-04-26 SURGICAL SUPPLY — 50 items
BANDAGE ADHESIVE 1X3 (GAUZE/BANDAGES/DRESSINGS) ×3 IMPLANT
BLADE SURG 15 STRL LF DISP TIS (BLADE) ×1 IMPLANT
BLADE SURG 15 STRL SS (BLADE) ×2
BLADE SURG ROTATE 9660 (MISCELLANEOUS) IMPLANT
CEMENT BONE KYPHX HV R (Orthopedic Implant) ×3 IMPLANT
CEMENT KYPHON C01A KIT/MIXER (Cement) ×3 IMPLANT
CEMENT MIXER KYPHON ×3 IMPLANT
CONT SPEC 4OZ CLIKSEAL STRL BL (MISCELLANEOUS) ×3 IMPLANT
DERMABOND ADVANCED (GAUZE/BANDAGES/DRESSINGS) ×2
DERMABOND ADVANCED .7 DNX12 (GAUZE/BANDAGES/DRESSINGS) ×1 IMPLANT
DRAPE C-ARM 42X72 X-RAY (DRAPES) ×3 IMPLANT
DRAPE LAPAROTOMY 100X72X124 (DRAPES) ×3 IMPLANT
DRAPE PROXIMA HALF (DRAPES) ×3 IMPLANT
DRAPE SURG 17X23 STRL (DRAPES) IMPLANT
DRESSING TELFA 8X3 (GAUZE/BANDAGES/DRESSINGS) IMPLANT
DURAPREP 26ML APPLICATOR (WOUND CARE) ×3 IMPLANT
GAUZE SPONGE 4X4 16PLY XRAY LF (GAUZE/BANDAGES/DRESSINGS) ×3 IMPLANT
GLOVE BIO SURGEON STRL SZ8 (GLOVE) ×3 IMPLANT
GLOVE BIOGEL PI IND STRL 7.5 (GLOVE) ×1 IMPLANT
GLOVE BIOGEL PI IND STRL 8 (GLOVE) ×1 IMPLANT
GLOVE BIOGEL PI IND STRL 8.5 (GLOVE) ×1 IMPLANT
GLOVE BIOGEL PI INDICATOR 7.5 (GLOVE) ×2
GLOVE BIOGEL PI INDICATOR 8 (GLOVE) ×2
GLOVE BIOGEL PI INDICATOR 8.5 (GLOVE) ×2
GLOVE ECLIPSE 7.5 STRL STRAW (GLOVE) ×6 IMPLANT
GLOVE EXAM NITRILE LRG STRL (GLOVE) IMPLANT
GLOVE EXAM NITRILE MD LF STRL (GLOVE) IMPLANT
GLOVE EXAM NITRILE XL STR (GLOVE) IMPLANT
GLOVE EXAM NITRILE XS STR PU (GLOVE) IMPLANT
GLOVE SURG SS PI 7.0 STRL IVOR (GLOVE) ×3 IMPLANT
GOWN BRE IMP SLV AUR LG STRL (GOWN DISPOSABLE) IMPLANT
GOWN BRE IMP SLV AUR XL STRL (GOWN DISPOSABLE) IMPLANT
GOWN STRL REIN 2XL LVL4 (GOWN DISPOSABLE) IMPLANT
GOWN STRL REUS W/ TWL LRG LVL3 (GOWN DISPOSABLE) ×2 IMPLANT
GOWN STRL REUS W/TWL 2XL LVL3 (GOWN DISPOSABLE) ×6 IMPLANT
GOWN STRL REUS W/TWL LRG LVL3 (GOWN DISPOSABLE) ×4
KIT BASIN OR (CUSTOM PROCEDURE TRAY) ×3 IMPLANT
KIT ROOM TURNOVER OR (KITS) ×3 IMPLANT
NEEDLE HYPO 25X1 1.5 SAFETY (NEEDLE) ×3 IMPLANT
NS IRRIG 1000ML POUR BTL (IV SOLUTION) ×3 IMPLANT
PACK SURGICAL SETUP 50X90 (CUSTOM PROCEDURE TRAY) ×3 IMPLANT
PAD ARMBOARD 7.5X6 YLW CONV (MISCELLANEOUS) ×9 IMPLANT
STAPLER SKIN PROX WIDE 3.9 (STAPLE) IMPLANT
SUT VIC AB 3-0 SH 8-18 (SUTURE) ×3 IMPLANT
SYR CONTROL 10ML LL (SYRINGE) ×6 IMPLANT
TOWEL OR 17X24 6PK STRL BLUE (TOWEL DISPOSABLE) ×3 IMPLANT
TOWEL OR 17X26 10 PK STRL BLUE (TOWEL DISPOSABLE) ×3 IMPLANT
TRAY KYPHOPAK 15/3 ONESTEP 1ST (MISCELLANEOUS) ×3 IMPLANT
TRAY KYPHOPAK 20/3 ONESTEP 1ST (MISCELLANEOUS) IMPLANT
TRAY KYPHOPAK 20/3 ONESTEP CDS (KITS) IMPLANT

## 2013-04-26 NOTE — Interval H&P Note (Signed)
History and Physical Interval Note:  04/26/2013 11:47 AM  Christopher Marshall  has presented today for surgery, with the diagnosis of Thoracic fracture, Kyphosis, Lumbago  The various methods of treatment have been discussed with the patient and family. After consideration of risks, benefits and other options for treatment, the patient has consented to  Procedure(s) with comments: T8 KYPHOPLASTY (N/A) - T8 KYPHOPLASTY as a surgical intervention .  The patient's history has been reviewed, patient examined, no change in status, stable for surgery.  I have reviewed the patient's chart and labs.  Questions were answered to the patient's satisfaction.     Mathius Birkeland D

## 2013-04-26 NOTE — Anesthesia Postprocedure Evaluation (Signed)
  Anesthesia Post-op Note  Patient: Christopher Marshall  Procedure(s) Performed: Procedure(s) with comments: Thoracic Eight KYPHOPLASTY (N/A) - Thoracic Eight KYPHOPLASTY  Patient Location: PACU  Anesthesia Type:MAC  Level of Consciousness: awake, alert  and oriented  Airway and Oxygen Therapy: Patient Spontanous Breathing  Post-op Pain: mild  Post-op Assessment: Post-op Vital signs reviewed, Patient's Cardiovascular Status Stable, Respiratory Function Stable, Patent Airway, No signs of Nausea or vomiting and Pain level controlled  Post-op Vital Signs: Reviewed and stable  Complications: No apparent anesthesia complications

## 2013-04-26 NOTE — Anesthesia Preprocedure Evaluation (Addendum)
Anesthesia Evaluation  Patient identified by MRN, date of birth, ID band Patient awake    Reviewed: Allergy & Precautions, H&P , NPO status , Patient's Chart, lab work & pertinent test results  History of Anesthesia Complications Negative for: history of anesthetic complications  Airway Mallampati: II TM Distance: >3 FB Neck ROM: Full    Dental  (+)    Pulmonary shortness of breath and with exertion, neg sleep apnea, neg COPD breath sounds clear to auscultation        Cardiovascular hypertension, Pt. on medications - angina+ Peripheral Vascular Disease and +CHF + dysrhythmias Atrial Fibrillation - Valvular Problems/MurmursRhythm:Irregular Rate:Normal     Neuro/Psych PSYCHIATRIC DISORDERS Anxiety Depression T8 vertebral fracture, s/p T10/11 kyphoplasty    GI/Hepatic Neg liver ROS, GERD-  Medicated and Controlled,Colostomy s/p colectomy for diverticulosis    Endo/Other  negative endocrine ROS  Renal/GU Renal Insufficiency and CRFRenal disease     Musculoskeletal  (+) Arthritis -, Osteoarthritis,    Abdominal   Peds  Hematology  (+) anemia , Chronic anticoagulation, h/o DVT   Anesthesia Other Findings   Reproductive/Obstetrics                         Anesthesia Physical Anesthesia Plan  ASA: III  Anesthesia Plan: MAC   Post-op Pain Management:    Induction: Intravenous  Airway Management Planned: Natural Airway and Simple Face Mask  Additional Equipment: None  Intra-op Plan:   Post-operative Plan:   Informed Consent: I have reviewed the patients History and Physical, chart, labs and discussed the procedure including the risks, benefits and alternatives for the proposed anesthesia with the patient or authorized representative who has indicated his/her understanding and acceptance.   Dental advisory given  Plan Discussed with: CRNA and Surgeon  Anesthesia Plan Comments:          Anesthesia Quick Evaluation

## 2013-04-26 NOTE — Transfer of Care (Signed)
Immediate Anesthesia Transfer of Care Note  Patient: Christopher Marshall  Procedure(s) Performed: Procedure(s) with comments: Thoracic Eight KYPHOPLASTY (N/A) - Thoracic Eight KYPHOPLASTY  Patient Location: PACU  Anesthesia Type:MAC  Level of Consciousness: awake, alert , oriented, patient cooperative and responds to stimulation  Airway & Oxygen Therapy: Patient Spontanous Breathing and Patient connected to face mask oxygen  Post-op Assessment: Report given to PACU RN, Post -op Vital signs reviewed and stable and Patient moving all extremities X 4  Post vital signs: Reviewed and stable  Complications: No apparent anesthesia complications

## 2013-04-26 NOTE — Op Note (Signed)
04/26/2013  3:38 PM  PATIENT:  Christopher Marshall  78 y.o. male  PRE-OPERATIVE DIAGNOSIS:  Thoracic 8 fracture, Kyphosis, Lumbago  POST-OPERATIVE DIAGNOSIS:  Thoracic 8 fracture, Kyphosis, Lumbago  PROCEDURE:  Procedure(s) with comments: Thoracic Eight KYPHOPLASTY (N/A) - Thoracic Eight KYPHOPLASTY  SURGEON:  Surgeon(s) and Role:    * Abdoulaye Drum, MD - Primary  PHYSICIAN ASSISTANT:   ASSISTANTS: none   ANESTHESIA:   IV sedation  EBL:  Total I/O In: 200 [I.V.:200] Out: -   BLOOD ADMINISTERED:none  DRAINS: none   LOCAL MEDICATIONS USED:  LIDOCAINE   SPECIMEN:  No Specimen  DISPOSITION OF SPECIMEN:  N/A  COUNTS:  YES  TOURNIQUET:  * No tourniquets in log *  DICTATION: DICTATION: Patient is 78 year old man with thoracic back pain.  He previously had an T 11, T 12, L 1  compression fracture and has now developed a painful T 8 compression fracture, which is proving debilitatingly painful to him.  It was elected to take him to surgery for kyphoplasty procedure.  PROCEDURE:  Following the administration of IV sedation, the patient was placed in a prone position on chest rolls.  C-arm fluoroscopy was positioned in both the AP and lateral planes, centered on the T 8 vertebra.  His back was prepped and draped in the usual sterile fashion with Duraprep.  Using a left uni-pedicular approach, the left T 8 pedicle and vertebral body were entered with the trochar using standard landmarks.  The drill was used, followed by a 15 cc Kyphon balloon, which was used to re-expand the broken vertebra.  Subsequently, approximately 6 cc of bone cement was placed into the void created by the balloon and was seen to fill the fracture cleft and fill the vertebra in both the AP and lateral direction with good interdigitation and without apparent extravasation. The bone void filler was then removed.  Final X-ray demonstrated good filling within the fractured vertebra.  The incision was closed with a single  3-0 vicryl stitch and dressed with Dermabond. The patient was returned to the OR gurney and taken to Recovery in stable and satisfactory condition, having tolerated the procedure well.  Counts were correct at the end of the case.   PLAN OF CARE: Discharge to home after PACU  PATIENT DISPOSITION:  PACU - hemodynamically stable.   Delay start of Pharmacological VTE agent (>24hrs) due to surgical blood loss or risk of bleeding: yes  

## 2013-04-26 NOTE — Discharge Instructions (Signed)

## 2013-04-26 NOTE — Progress Notes (Signed)
Awake, alert,conversant.  MAEW.  Good strength both legs.

## 2013-04-26 NOTE — Preoperative (Signed)
Beta Blockers   Reason not to administer Beta Blockers:Not Applicable 

## 2013-04-26 NOTE — Brief Op Note (Signed)
04/26/2013  3:38 PM  PATIENT:  Christopher Marshall  79 y.o. male  PRE-OPERATIVE DIAGNOSIS:  Thoracic 8 fracture, Kyphosis, Lumbago  POST-OPERATIVE DIAGNOSIS:  Thoracic 8 fracture, Kyphosis, Lumbago  PROCEDURE:  Procedure(s) with comments: Thoracic Eight KYPHOPLASTY (N/A) - Thoracic Eight KYPHOPLASTY  SURGEON:  Surgeon(s) and Role:    * Erline Levine, MD - Primary  PHYSICIAN ASSISTANT:   ASSISTANTS: none   ANESTHESIA:   IV sedation  EBL:  Total I/O In: 200 [I.V.:200] Out: -   BLOOD ADMINISTERED:none  DRAINS: none   LOCAL MEDICATIONS USED:  LIDOCAINE   SPECIMEN:  No Specimen  DISPOSITION OF SPECIMEN:  N/A  COUNTS:  YES  TOURNIQUET:  * No tourniquets in log *  DICTATION: DICTATION: Patient is 79 year old man with thoracic back pain.  He previously had an T 11, T 12, L 1  compression fracture and has now developed a painful T 8 compression fracture, which is proving debilitatingly painful to him.  It was elected to take him to surgery for kyphoplasty procedure.  PROCEDURE:  Following the administration of IV sedation, the patient was placed in a prone position on chest rolls.  C-arm fluoroscopy was positioned in both the AP and lateral planes, centered on the T 8 vertebra.  His back was prepped and draped in the usual sterile fashion with Duraprep.  Using a left uni-pedicular approach, the left T 8 pedicle and vertebral body were entered with the trochar using standard landmarks.  The drill was used, followed by a 15 cc Kyphon balloon, which was used to re-expand the broken vertebra.  Subsequently, approximately 6 cc of bone cement was placed into the void created by the balloon and was seen to fill the fracture cleft and fill the vertebra in both the AP and lateral direction with good interdigitation and without apparent extravasation. The bone void filler was then removed.  Final X-ray demonstrated good filling within the fractured vertebra.  The incision was closed with a single  3-0 vicryl stitch and dressed with Dermabond. The patient was returned to the [facility] and taken to Recovery in stable and satisfactory condition, having tolerated the procedure well.  Counts were correct at the end of the case.   PLAN OF CARE: Discharge to home after PACU  PATIENT DISPOSITION:  PACU - hemodynamically stable.   Delay start of Pharmacological VTE agent (>24hrs) due to surgical blood loss or risk of bleeding: yes

## 2013-04-29 ENCOUNTER — Encounter (HOSPITAL_COMMUNITY): Payer: Self-pay | Admitting: Neurosurgery

## 2013-05-09 ENCOUNTER — Other Ambulatory Visit: Payer: Self-pay | Admitting: Neurosurgery

## 2013-05-10 ENCOUNTER — Encounter (HOSPITAL_COMMUNITY): Payer: Self-pay | Admitting: Pharmacy Technician

## 2013-05-11 NOTE — Progress Notes (Signed)
Spoke with pt spouse to complete SDW call. Pt spouse stated  that pt has had "slurred speech and a blood-shot eye since last week when his Gabapentin dose was decreased to 300 mg per day. He was seen in Dr. Melven Sartorius office on Monday and I forgot to mention the slurred speech, I tried to call Dr. Vertell Limber today but got no answer. "  Pt spouse was advised that pt should be seen immediately at the nearest emergency department for evaluation.  Spoke with Aaron Edelman, Dr. Melven Sartorius nurse at [phone] and was informed that he was aware of pt slurred speech and eye at [facility] visit.

## 2013-05-12 ENCOUNTER — Encounter (HOSPITAL_COMMUNITY): Payer: Self-pay | Admitting: *Deleted

## 2013-05-12 MED ORDER — VANCOMYCIN HCL 10 G IV SOLR
1500.0000 mg | INTRAVENOUS | Status: AC
  Administered 2013-05-13: 1500 mg via INTRAVENOUS
  Filled 2013-05-12: qty 1500

## 2013-05-12 NOTE — Progress Notes (Signed)
According to Mrs.Gardner Candle, pt spouse, pt was instructed that he could eat up until 9:00 AM ( a light breakfast) since he didn't have to arrive for the procedure until 3:00 PM by surgeons staff. According to pt spouse, pt last dose of Xarelto was administered on Sunday 05/08/13 per MD instructions.

## 2013-05-13 ENCOUNTER — Encounter (HOSPITAL_COMMUNITY): Payer: Self-pay | Admitting: *Deleted

## 2013-05-13 ENCOUNTER — Inpatient Hospital Stay (HOSPITAL_COMMUNITY): Payer: Medicare Other | Admitting: Certified Registered Nurse Anesthetist

## 2013-05-13 ENCOUNTER — Inpatient Hospital Stay (HOSPITAL_COMMUNITY)
Admission: RE | Admit: 2013-05-13 | Discharge: 2013-05-18 | DRG: 516 | Disposition: A | Discharge status: Home Health | Payer: Medicare Other | Source: Ambulatory Visit | Attending: Internal Medicine | Admitting: Internal Medicine

## 2013-05-13 ENCOUNTER — Encounter (HOSPITAL_COMMUNITY): Payer: Medicare Other | Admitting: Certified Registered Nurse Anesthetist

## 2013-05-13 ENCOUNTER — Encounter (HOSPITAL_COMMUNITY)
Admission: RE | Disposition: A | Discharge status: Home Health | Payer: Self-pay | Source: Ambulatory Visit | Attending: Neurosurgery

## 2013-05-13 ENCOUNTER — Inpatient Hospital Stay (HOSPITAL_COMMUNITY): Payer: Medicare Other

## 2013-05-13 DIAGNOSIS — E039 Hypothyroidism, unspecified: Secondary | ICD-10-CM | POA: Diagnosis present

## 2013-05-13 DIAGNOSIS — I4891 Unspecified atrial fibrillation: Secondary | ICD-10-CM | POA: Diagnosis present

## 2013-05-13 DIAGNOSIS — R600 Localized edema: Secondary | ICD-10-CM

## 2013-05-13 DIAGNOSIS — M8448XA Pathological fracture, other site, initial encounter for fracture: Principal | ICD-10-CM | POA: Diagnosis present

## 2013-05-13 DIAGNOSIS — M4854XA Collapsed vertebra, not elsewhere classified, thoracic region, initial encounter for fracture: Secondary | ICD-10-CM | POA: Diagnosis present

## 2013-05-13 DIAGNOSIS — R609 Edema, unspecified: Secondary | ICD-10-CM | POA: Diagnosis present

## 2013-05-13 DIAGNOSIS — I129 Hypertensive chronic kidney disease with stage 1 through stage 4 chronic kidney disease, or unspecified chronic kidney disease: Secondary | ICD-10-CM | POA: Diagnosis present

## 2013-05-13 DIAGNOSIS — F411 Generalized anxiety disorder: Secondary | ICD-10-CM | POA: Diagnosis present

## 2013-05-13 DIAGNOSIS — Z86718 Personal history of other venous thrombosis and embolism: Secondary | ICD-10-CM

## 2013-05-13 DIAGNOSIS — M4000 Postural kyphosis, site unspecified: Secondary | ICD-10-CM | POA: Diagnosis present

## 2013-05-13 DIAGNOSIS — I2589 Other forms of chronic ischemic heart disease: Secondary | ICD-10-CM | POA: Diagnosis present

## 2013-05-13 DIAGNOSIS — M810 Age-related osteoporosis without current pathological fracture: Secondary | ICD-10-CM | POA: Diagnosis present

## 2013-05-13 DIAGNOSIS — N184 Chronic kidney disease, stage 4 (severe): Secondary | ICD-10-CM

## 2013-05-13 DIAGNOSIS — I872 Venous insufficiency (chronic) (peripheral): Secondary | ICD-10-CM | POA: Diagnosis present

## 2013-05-13 DIAGNOSIS — I739 Peripheral vascular disease, unspecified: Secondary | ICD-10-CM | POA: Diagnosis present

## 2013-05-13 DIAGNOSIS — M129 Arthropathy, unspecified: Secondary | ICD-10-CM | POA: Diagnosis present

## 2013-05-13 DIAGNOSIS — F329 Major depressive disorder, single episode, unspecified: Secondary | ICD-10-CM | POA: Diagnosis present

## 2013-05-13 DIAGNOSIS — K219 Gastro-esophageal reflux disease without esophagitis: Secondary | ICD-10-CM | POA: Diagnosis present

## 2013-05-13 DIAGNOSIS — Z8249 Family history of ischemic heart disease and other diseases of the circulatory system: Secondary | ICD-10-CM

## 2013-05-13 DIAGNOSIS — N400 Enlarged prostate without lower urinary tract symptoms: Secondary | ICD-10-CM

## 2013-05-13 DIAGNOSIS — IMO0002 Reserved for concepts with insufficient information to code with codable children: Secondary | ICD-10-CM

## 2013-05-13 DIAGNOSIS — Z7901 Long term (current) use of anticoagulants: Secondary | ICD-10-CM

## 2013-05-13 DIAGNOSIS — D649 Anemia, unspecified: Secondary | ICD-10-CM | POA: Diagnosis present

## 2013-05-13 DIAGNOSIS — S22080A Wedge compression fracture of T11-T12 vertebra, initial encounter for closed fracture: Secondary | ICD-10-CM

## 2013-05-13 DIAGNOSIS — F3289 Other specified depressive episodes: Secondary | ICD-10-CM | POA: Diagnosis present

## 2013-05-13 DIAGNOSIS — J9819 Other pulmonary collapse: Secondary | ICD-10-CM | POA: Diagnosis not present

## 2013-05-13 DIAGNOSIS — M109 Gout, unspecified: Secondary | ICD-10-CM

## 2013-05-13 HISTORY — PX: KYPHOPLASTY: SHX5884

## 2013-05-13 LAB — CBC
HCT: 33.8 % — ABNORMAL LOW (ref 39.0–52.0)
HEMOGLOBIN: 11.2 g/dL — AB (ref 13.0–17.0)
MCH: 30.7 pg (ref 26.0–34.0)
MCHC: 33.1 g/dL (ref 30.0–36.0)
MCV: 92.6 fL (ref 78.0–100.0)
PLATELETS: 218 10*3/uL (ref 150–400)
RBC: 3.65 MIL/uL — ABNORMAL LOW (ref 4.22–5.81)
RDW: 15.2 % (ref 11.5–15.5)
WBC: 8.8 10*3/uL (ref 4.0–10.5)

## 2013-05-13 LAB — BASIC METABOLIC PANEL
BUN: 51 mg/dL — ABNORMAL HIGH (ref 6–23)
CO2: 21 mEq/L (ref 19–32)
Calcium: 9.4 mg/dL (ref 8.4–10.5)
Chloride: 97 mEq/L (ref 96–112)
Creatinine, Ser: 3.04 mg/dL — ABNORMAL HIGH (ref 0.50–1.35)
GFR calc Af Amer: 21 mL/min — ABNORMAL LOW (ref >90)
GFR, EST NON AFRICAN AMERICAN: 19 mL/min — AB (ref >90)
GLUCOSE: 114 mg/dL — AB (ref 70–99)
Potassium: 4.8 mEq/L (ref 3.7–5.3)
SODIUM: 138 meq/L (ref 137–147)

## 2013-05-13 SURGERY — KYPHOPLASTY
Anesthesia: Monitor Anesthesia Care

## 2013-05-13 MED ORDER — DOCUSATE SODIUM 100 MG PO CAPS
100.0000 mg | ORAL_CAPSULE | Freq: Two times a day (BID) | ORAL | Status: DC | PRN
  Administered 2013-05-16: 100 mg via ORAL
  Filled 2013-05-13: qty 1

## 2013-05-13 MED ORDER — DILTIAZEM HCL ER COATED BEADS 240 MG PO CP24
240.0000 mg | ORAL_CAPSULE | Freq: Every day | ORAL | Status: DC
  Administered 2013-05-16 – 2013-05-18 (×2): 240 mg via ORAL
  Filled 2013-05-13 (×5): qty 1

## 2013-05-13 MED ORDER — PROPOFOL 10 MG/ML IV BOLUS
INTRAVENOUS | Status: AC
  Filled 2013-05-13: qty 20

## 2013-05-13 MED ORDER — ALLOPURINOL 100 MG PO TABS
100.0000 mg | ORAL_TABLET | Freq: Every day | ORAL | Status: DC
  Administered 2013-05-14 – 2013-05-18 (×5): 100 mg via ORAL
  Filled 2013-05-13 (×5): qty 1

## 2013-05-13 MED ORDER — ONDANSETRON HCL 4 MG/2ML IJ SOLN: 4.0000 mg | INTRAMUSCULAR | Status: DC | PRN

## 2013-05-13 MED ORDER — PANTOPRAZOLE SODIUM 40 MG IV SOLR
40.0000 mg | Freq: Every day | INTRAVENOUS | Status: DC
  Administered 2013-05-13: 40 mg via INTRAVENOUS
  Filled 2013-05-13 (×2): qty 40

## 2013-05-13 MED ORDER — FUROSEMIDE 40 MG PO TABS
40.0000 mg | ORAL_TABLET | Freq: Every day | ORAL | Status: DC
  Administered 2013-05-13 – 2013-05-16 (×4): 40 mg via ORAL
  Filled 2013-05-13 (×4): qty 1

## 2013-05-13 MED ORDER — FENTANYL CITRATE 0.05 MG/ML IJ SOLN
INTRAMUSCULAR | Status: AC
  Filled 2013-05-13: qty 2

## 2013-05-13 MED ORDER — FLEET ENEMA 7-19 GM/118ML RE ENEM: 1.0000 | ENEMA | Freq: Once | RECTAL | Status: AC | PRN

## 2013-05-13 MED ORDER — FENTANYL CITRATE 0.05 MG/ML IJ SOLN
INTRAMUSCULAR | Status: AC
  Filled 2013-05-13: qty 5

## 2013-05-13 MED ORDER — SUCCINYLCHOLINE CHLORIDE 20 MG/ML IJ SOLN
INTRAMUSCULAR | Status: AC
  Filled 2013-05-13: qty 1

## 2013-05-13 MED ORDER — LIDOCAINE HCL (CARDIAC) 20 MG/ML IV SOLN
INTRAVENOUS | Status: AC
  Filled 2013-05-13: qty 5

## 2013-05-13 MED ORDER — DOCUSATE SODIUM 100 MG PO CAPS
100.0000 mg | ORAL_CAPSULE | Freq: Two times a day (BID) | ORAL | Status: DC
  Administered 2013-05-13 – 2013-05-18 (×10): 100 mg via ORAL
  Filled 2013-05-13 (×8): qty 1

## 2013-05-13 MED ORDER — PHENOL 1.4 % MT LIQD: 1.0000 | OROMUCOSAL | Status: DC | PRN

## 2013-05-13 MED ORDER — HYDROMORPHONE HCL 2 MG PO TABS
2.0000 mg | ORAL_TABLET | ORAL | Status: DC | PRN
  Administered 2013-05-14 – 2013-05-15 (×3): 2 mg via ORAL
  Filled 2013-05-13 (×3): qty 1

## 2013-05-13 MED ORDER — FENTANYL CITRATE 0.05 MG/ML IJ SOLN
INTRAMUSCULAR | Status: DC | PRN
  Administered 2013-05-13: 100 ug via INTRAVENOUS
  Administered 2013-05-13 (×2): 50 ug via INTRAVENOUS

## 2013-05-13 MED ORDER — ACETAMINOPHEN 650 MG RE SUPP: 650.0000 mg | RECTAL | Status: DC | PRN

## 2013-05-13 MED ORDER — BUPIVACAINE HCL (PF) 0.25 % IJ SOLN
INTRAMUSCULAR | Status: DC | PRN
  Administered 2013-05-13: 5 mL

## 2013-05-13 MED ORDER — SODIUM CHLORIDE 0.9 % IJ SOLN
3.0000 mL | Freq: Two times a day (BID) | INTRAMUSCULAR | Status: DC
  Administered 2013-05-13: 3 mL via INTRAVENOUS

## 2013-05-13 MED ORDER — OXYCODONE-ACETAMINOPHEN 5-325 MG PO TABS
1.0000 | ORAL_TABLET | ORAL | Status: DC | PRN
  Administered 2013-05-14 – 2013-05-16 (×4): 2 via ORAL
  Administered 2013-05-16 – 2013-05-17 (×3): 1 via ORAL
  Administered 2013-05-18: 2 via ORAL
  Administered 2013-05-18: 1 via ORAL
  Filled 2013-05-13 (×5): qty 2
  Filled 2013-05-13: qty 1
  Filled 2013-05-13: qty 2
  Filled 2013-05-13: qty 1
  Filled 2013-05-13 (×3): qty 2
  Filled 2013-05-13: qty 1
  Filled 2013-05-13 (×2): qty 2

## 2013-05-13 MED ORDER — KCL IN DEXTROSE-NACL 20-5-0.45 MEQ/L-%-% IV SOLN
INTRAVENOUS | Status: DC
  Administered 2013-05-13: 23:00:00 via INTRAVENOUS
  Filled 2013-05-13 (×6): qty 1000

## 2013-05-13 MED ORDER — ONDANSETRON HCL 4 MG/2ML IJ SOLN
INTRAMUSCULAR | Status: DC | PRN
  Administered 2013-05-13: 4 mg via INTRAVENOUS

## 2013-05-13 MED ORDER — SENNA 8.6 MG PO TABS
1.0000 | ORAL_TABLET | Freq: Two times a day (BID) | ORAL | Status: DC
  Administered 2013-05-14 – 2013-05-18 (×9): 8.6 mg via ORAL
  Filled 2013-05-13 (×11): qty 1

## 2013-05-13 MED ORDER — ONDANSETRON HCL 4 MG/2ML IJ SOLN
INTRAMUSCULAR | Status: AC
  Filled 2013-05-13: qty 2

## 2013-05-13 MED ORDER — MIRTAZAPINE 15 MG PO TABS
15.0000 mg | ORAL_TABLET | Freq: Every day | ORAL | Status: DC
  Administered 2013-05-13 – 2013-05-17 (×5): 15 mg via ORAL
  Filled 2013-05-13 (×6): qty 1

## 2013-05-13 MED ORDER — PANTOPRAZOLE SODIUM 40 MG PO TBEC: 40.0000 mg | DELAYED_RELEASE_TABLET | Freq: Every day | ORAL | Status: DC

## 2013-05-13 MED ORDER — VITAMIN D3 25 MCG (1000 UNIT) PO TABS
1000.0000 [IU] | ORAL_TABLET | Freq: Every day | ORAL | Status: DC
  Administered 2013-05-14 – 2013-05-18 (×5): 1000 [IU] via ORAL
  Filled 2013-05-13 (×5): qty 1

## 2013-05-13 MED ORDER — TAMSULOSIN HCL 0.4 MG PO CAPS
0.4000 mg | ORAL_CAPSULE | Freq: Every day | ORAL | Status: DC
  Administered 2013-05-13 – 2013-05-17 (×5): 0.4 mg via ORAL
  Filled 2013-05-13 (×6): qty 1

## 2013-05-13 MED ORDER — ACETAMINOPHEN 325 MG PO TABS: 650.0000 mg | ORAL_TABLET | ORAL | Status: DC | PRN

## 2013-05-13 MED ORDER — 0.9 % SODIUM CHLORIDE (POUR BTL) OPTIME
TOPICAL | Status: DC | PRN
  Administered 2013-05-13: 1000 mL

## 2013-05-13 MED ORDER — HYDROCODONE-ACETAMINOPHEN 5-325 MG PO TABS
1.0000 | ORAL_TABLET | ORAL | Status: DC | PRN
  Filled 2013-05-13: qty 1

## 2013-05-13 MED ORDER — POLYETHYLENE GLYCOL 3350 17 G PO PACK
17.0000 g | PACK | Freq: Every day | ORAL | Status: DC | PRN
  Filled 2013-05-13: qty 1

## 2013-05-13 MED ORDER — FENTANYL CITRATE 0.05 MG/ML IJ SOLN
25.0000 ug | INTRAMUSCULAR | Status: DC | PRN
  Administered 2013-05-13 (×3): 50 ug via INTRAVENOUS

## 2013-05-13 MED ORDER — MENTHOL 3 MG MT LOZG: 1.0000 | LOZENGE | OROMUCOSAL | Status: DC | PRN

## 2013-05-13 MED ORDER — BISACODYL 10 MG RE SUPP: 10.0000 mg | Freq: Every day | RECTAL | Status: DC | PRN

## 2013-05-13 MED ORDER — SODIUM CHLORIDE 0.9 % IV SOLN
INTRAVENOUS | Status: DC
  Administered 2013-05-13: 20 mL/h via INTRAVENOUS

## 2013-05-13 MED ORDER — FINASTERIDE 5 MG PO TABS
5.0000 mg | ORAL_TABLET | Freq: Every day | ORAL | Status: DC
Start: 2013-05-14 — End: 2013-05-18
  Administered 2013-05-14 – 2013-05-18 (×5): 5 mg via ORAL
  Filled 2013-05-13 (×5): qty 1

## 2013-05-13 MED ORDER — HYDROMORPHONE HCL PF 1 MG/ML IJ SOLN
0.5000 mg | INTRAMUSCULAR | Status: DC | PRN
Start: 2013-05-13 — End: 2013-05-18
  Administered 2013-05-14: 0.5 mg via INTRAVENOUS
  Filled 2013-05-13: qty 1

## 2013-05-13 MED ORDER — SODIUM CHLORIDE 0.9 % IV SOLN
INTRAVENOUS | Status: DC | PRN
  Administered 2013-05-13: 19:00:00 via INTRAVENOUS

## 2013-05-13 MED ORDER — DIAZEPAM 5 MG PO TABS
5.0000 mg | ORAL_TABLET | Freq: Four times a day (QID) | ORAL | Status: DC | PRN
  Administered 2013-05-13 – 2013-05-15 (×3): 5 mg via ORAL
  Filled 2013-05-13 (×2): qty 1

## 2013-05-13 MED ORDER — LEVOTHYROXINE SODIUM 75 MCG PO TABS
75.0000 ug | ORAL_TABLET | Freq: Every day | ORAL | Status: DC
  Administered 2013-05-14 – 2013-05-18 (×5): 75 ug via ORAL
  Filled 2013-05-13 (×7): qty 1

## 2013-05-13 MED ORDER — PREDNISONE 5 MG PO TABS
7.5000 mg | ORAL_TABLET | Freq: Every day | ORAL | Status: DC
  Administered 2013-05-15 – 2013-05-18 (×4): 7.5 mg via ORAL
  Filled 2013-05-13 (×6): qty 1

## 2013-05-13 MED ORDER — SODIUM CHLORIDE 0.9 % IJ SOLN: 3.0000 mL | INTRAMUSCULAR | Status: DC | PRN

## 2013-05-13 MED ORDER — GABAPENTIN 300 MG PO CAPS
300.0000 mg | ORAL_CAPSULE | Freq: Every day | ORAL | Status: DC
  Administered 2013-05-13 – 2013-05-14 (×2): 300 mg via ORAL
  Filled 2013-05-13 (×2): qty 1

## 2013-05-13 MED ORDER — FERROUS GLUCONATE 324 (38 FE) MG PO TABS
324.0000 mg | ORAL_TABLET | Freq: Every day | ORAL | Status: DC
  Administered 2013-05-14 – 2013-05-18 (×5): 324 mg via ORAL
  Filled 2013-05-13 (×6): qty 1

## 2013-05-13 MED ORDER — DIAZEPAM 5 MG PO TABS
ORAL_TABLET | ORAL | Status: AC
Start: 2013-05-13 — End: 2013-05-14
  Filled 2013-05-13: qty 1

## 2013-05-13 MED ORDER — LIDOCAINE-EPINEPHRINE 1 %-1:100000 IJ SOLN
INTRAMUSCULAR | Status: DC | PRN
Start: 2013-05-13 — End: 2013-05-13
  Administered 2013-05-13: 5 mL via INTRADERMAL

## 2013-05-13 MED ORDER — IOHEXOL 300 MG/ML  SOLN
INTRAMUSCULAR | Status: DC | PRN
  Administered 2013-05-13: 50 mL

## 2013-05-13 MED ORDER — SODIUM CHLORIDE 0.9 % IV SOLN: 250.0000 mL | INTRAVENOUS | Status: DC

## 2013-05-13 SURGICAL SUPPLY — 47 items
BLADE SURG 15 STRL LF DISP TIS (BLADE) ×1 IMPLANT
BLADE SURG 15 STRL SS (BLADE) ×2
BLADE SURG ROTATE 9660 (MISCELLANEOUS) IMPLANT
CEMENT BONE KYPHON CDS (Cement) ×3 IMPLANT
CEMENT KYPHON CX01A KIT/MIXER (Cement) ×3 IMPLANT
CONT SPEC 4OZ CLIKSEAL STRL BL (MISCELLANEOUS) ×3 IMPLANT
DERMABOND ADVANCED (GAUZE/BANDAGES/DRESSINGS) ×2
DERMABOND ADVANCED .7 DNX12 (GAUZE/BANDAGES/DRESSINGS) ×1 IMPLANT
DRAPE C-ARM 42X72 X-RAY (DRAPES) ×3 IMPLANT
DRAPE LAPAROTOMY 100X72X124 (DRAPES) ×3 IMPLANT
DRAPE PROXIMA HALF (DRAPES) ×3 IMPLANT
DRAPE SURG 17X23 STRL (DRAPES) IMPLANT
DRESSING TELFA 8X3 (GAUZE/BANDAGES/DRESSINGS) IMPLANT
DURAPREP 26ML APPLICATOR (WOUND CARE) ×3 IMPLANT
GAUZE SPONGE 4X4 16PLY XRAY LF (GAUZE/BANDAGES/DRESSINGS) ×3 IMPLANT
GLOVE BIO SURGEON STRL SZ8 (GLOVE) ×3 IMPLANT
GLOVE BIOGEL PI IND STRL 7.5 (GLOVE) ×1 IMPLANT
GLOVE BIOGEL PI IND STRL 8.5 (GLOVE) ×1 IMPLANT
GLOVE BIOGEL PI INDICATOR 7.5 (GLOVE) ×2
GLOVE BIOGEL PI INDICATOR 8.5 (GLOVE) ×2
GLOVE ECLIPSE 7.5 STRL STRAW (GLOVE) ×6 IMPLANT
GLOVE EXAM NITRILE LRG STRL (GLOVE) IMPLANT
GLOVE EXAM NITRILE MD LF STRL (GLOVE) IMPLANT
GLOVE EXAM NITRILE XL STR (GLOVE) IMPLANT
GLOVE EXAM NITRILE XS STR PU (GLOVE) IMPLANT
GOWN BRE IMP SLV AUR LG STRL (GOWN DISPOSABLE) IMPLANT
GOWN BRE IMP SLV AUR XL STRL (GOWN DISPOSABLE) IMPLANT
GOWN STRL REIN 2XL LVL4 (GOWN DISPOSABLE) IMPLANT
GOWN STRL REUS W/ TWL LRG LVL3 (GOWN DISPOSABLE) ×1 IMPLANT
GOWN STRL REUS W/ TWL XL LVL3 (GOWN DISPOSABLE) ×1 IMPLANT
GOWN STRL REUS W/TWL LRG LVL3 (GOWN DISPOSABLE) ×2
GOWN STRL REUS W/TWL XL LVL3 (GOWN DISPOSABLE) ×2
KIT BASIN OR (CUSTOM PROCEDURE TRAY) ×3 IMPLANT
KIT ROOM TURNOVER OR (KITS) ×3 IMPLANT
NEEDLE HYPO 25X1 1.5 SAFETY (NEEDLE) ×3 IMPLANT
NS IRRIG 1000ML POUR BTL (IV SOLUTION) ×3 IMPLANT
ONE-STEP OSTEO INDUCER SYSTEM ×2 IMPLANT
PACK SURGICAL SETUP 50X90 (CUSTOM PROCEDURE TRAY) ×3 IMPLANT
PAD ARMBOARD 7.5X6 YLW CONV (MISCELLANEOUS) ×9 IMPLANT
STAPLER SKIN PROX WIDE 3.9 (STAPLE) ×3 IMPLANT
SUT VIC AB 3-0 SH 8-18 (SUTURE) ×3 IMPLANT
SYR CONTROL 10ML LL (SYRINGE) ×6 IMPLANT
TOWEL OR 17X24 6PK STRL BLUE (TOWEL DISPOSABLE) ×3 IMPLANT
TOWEL OR 17X26 10 PK STRL BLUE (TOWEL DISPOSABLE) ×3 IMPLANT
TRAY KYPHOPAK 15/3 ONESTEP CDS ×3 IMPLANT
TRAY KYPHOPAK 20/3 ONESTEP 1ST (MISCELLANEOUS) IMPLANT
TRAY KYPHOPAK 20/3 ONESTEP CDS (KITS) IMPLANT

## 2013-05-13 NOTE — Anesthesia Postprocedure Evaluation (Signed)
Anesthesia Post Note  Patient: Christopher Marshall  Procedure(s) Performed: Procedure(s) (LRB): Thoracic Nine and Thoracic Ten KYPHOPLASTY (N/A)  Anesthesia type: MAC  Patient location: PACU  Post pain: Pain level controlled  Post assessment: Post-op Vital signs reviewed  Last Vitals: BP 144/99  Pulse 91  Temp(Src) 36.3 C (Oral)  Resp 15  Ht 6\' 1"  (1.854 m)  Wt 212 lb (96.163 kg)  BMI 27.98 kg/m2  SpO2 96%  Post vital signs: Reviewed  Level of consciousness: awake  Complications: No apparent anesthesia complications

## 2013-05-13 NOTE — Anesthesia Preprocedure Evaluation (Addendum)
Anesthesia Evaluation  Patient identified by MRN, date of birth, ID band Patient awake    Reviewed: Allergy & Precautions, H&P , NPO status , Patient's Chart, lab work & pertinent test results  Airway Mallampati: II  Neck ROM: Limited  Mouth opening: Limited Mouth Opening  Dental no notable dental hx. (+) Dental Advisory Given, Chipped, Missing   Pulmonary shortness of breath and with exertion,  breath sounds clear to auscultation        Cardiovascular hypertension, Pt. on medications + Peripheral Vascular Disease and DVT + dysrhythmias Atrial Fibrillation Rhythm:Regular Rate:Normal  Echo on 01/06/13 showed normal LV size with moderate symmetric LVH, mild global hypokinesis, EF 45-50%   Nuclear stress test on 01/05/13 showed normal LVEDV, no TID. Small in size, moderate in severity fixed inferoapical wall defect consistent with attenuation artifact, no ischemia. Grossly normal LV function and wall motion by visual estimation. EF by computer was 39%   Neuro/Psych PSYCHIATRIC DISORDERS Anxiety Depression Chronic back pain    GI/Hepatic Neg liver ROS, GERD-  Controlled,  Endo/Other  negative endocrine ROS  Renal/GU CRFRenal disease (creat 3.04)     Musculoskeletal  (+) Arthritis - (took prednisone today), on steriods ,    Abdominal   Peds  Hematology  (+) Blood dyscrasia (Hb 11.2, on xarelto (last taken sunday)), anemia ,   Anesthesia Other Findings   Reproductive/Obstetrics                      Anesthesia Physical Anesthesia Plan  ASA: III  Anesthesia Plan: MAC   Post-op Pain Management:    Induction: Intravenous  Airway Management Planned: Simple Face Mask and Natural Airway  Additional Equipment:   Intra-op Plan:   Post-operative Plan:   Informed Consent: I have reviewed the patients History and Physical, chart, labs and discussed the procedure including the risks, benefits and  alternatives for the proposed anesthesia with the patient or authorized representative who has indicated his/her understanding and acceptance.   Dental advisory given  Plan Discussed with: CRNA, Anesthesiologist and Surgeon  Anesthesia Plan Comments:         Anesthesia Quick Evaluation

## 2013-05-13 NOTE — Transfer of Care (Signed)
Immediate Anesthesia Transfer of Care Note  Patient: Christopher Marshall  Procedure(s) Performed: Procedure(s) with comments: Thoracic Nine and Thoracic Ten KYPHOPLASTY (N/A) - Thoracic Nine and Thoracic Ten KYPHOPLASTY  Patient Location: PACU  Anesthesia Type:MAC  Level of Consciousness: awake, alert , oriented and patient cooperative  Airway & Oxygen Therapy: Patient Spontanous Breathing  Post-op Assessment: Report given to PACU RN, Post -op Vital signs reviewed and stable and Patient moving all extremities X 4  Post vital signs: Reviewed and stable  Complications: No apparent anesthesia complications

## 2013-05-13 NOTE — Op Note (Signed)
05/13/2013  8:00 PM  PATIENT:  Christopher Marshall  79 y.o. male  PRE-OPERATIVE DIAGNOSIS:  Thoracic fracture T 9 and T 10  POST-OPERATIVE DIAGNOSIS:  Thoracic fracture T 9 and T 10  PROCEDURE:  Procedure(s) with comments: Thoracic Nine and Thoracic Ten KYPHOPLASTY (N/A) - Thoracic Nine and Thoracic Ten KYPHOPLASTY  SURGEON:  Surgeon(s) and Role:    * Erline Levine, MD - Primary  PHYSICIAN ASSISTANT:   ASSISTANTS: None   ANESTHESIA:   IV sedation  EBL:  Total I/O In: 100 [I.V.:100] Out: -   BLOOD ADMINISTERED:none  DRAINS: none   LOCAL MEDICATIONS USED:  LIDOCAINE   SPECIMEN:  No Specimen  DISPOSITION OF SPECIMEN:  N/A  COUNTS:  YES  TOURNIQUET:  * No tourniquets in log *  DICTATION: DICTATION: Patient is 79 year old man with osteoporosis and multi-level thoracolumbar compression fractures with intractable pain..   It was elected to take him to surgery for kyphoplasty procedure of T 9 and T 10 levels.  PROCEDURE:  Following the initiation of intravenous sedation monitored by  anesthesia, the patient was placed in a prone position on chest rolls.  C-arm fluoroscopy was positioned in both the AP and lateral planes, centered on the L T 9 and T 10  vertebrae.  His back was prepped and draped in the usual sterile fashion with Duraprep.  Using a right uni-pedicular approach, the right T 9 pedicle and vertebral body were entered with the trochar using standard landmarks.  The drill was used, followed by a 15 cc Kyphon balloon, which was used to re-expand the broken vertebra.  Subsequently, 6 cc of bone cement was placed into the void created by the balloon and was seen to fill the fracture cleft and fill the vertebra in both the AP and lateral direction with good interdigitation and without apparent extravasation. The bone void filler was then removed.  Using a right uni-pedicular approach, the right T 10 pedicle and vertebral body were entered with the trochar using standard landmarks.   The drill was used, followed by a 15 cc Kyphon balloon, which was used to re-expand the broken vertebra.  Subsequently, 8 cc of bone cement was placed into the void created by the balloon and was seen to fill the fracture cleft and fill the vertebra in both the AP and lateral direction with good interdigitation and without apparent extravasation. The bone void filler was then removed. Final X-ray demonstrated good filling within the fractured vertebra.  The incision was closed with a single 3-0 vicryl stitch at each incision and dressed with Dermabond. The patient was returned to the [facility] and taken to Recovery in stable and satisfactory condition, having tolerated the procedure well.  Counts were correct at the end of the case.   PLAN OF CARE: Admit for overnight observation  PATIENT DISPOSITION:  PACU - hemodynamically stable.   Delay start of Pharmacological VTE agent (>24hrs) due to surgical blood loss or risk of bleeding: yes

## 2013-05-13 NOTE — Progress Notes (Signed)
Awake, alert, conversant.  MAEW with good strength.  Doing well. 

## 2013-05-13 NOTE — H&P (Signed)
[place], [place] [address] Phone: [phone]   Patient ID:   [id number] Patient: Christopher Marshall  Date of Birth: 08/05/1934 Visit Type: Office Visit   Date: 05/09/2013 11:15 AM Provider: Marchia Meiers. Vertell Limber MD   This 79 year old male presents for Follow Up of back pain.  History of Present Illness: 1.  Follow Up of back pain  T8 Kyphoplasty 04/26/13  Pt reports severe pain right side ribcage since 2 days post-op.  Dilaudid 2mg  4-5/day offers no relief. Gabapentin 300mg  from ED stopped weeks ago d/t hallucinations, but one dose last week did help with his pain.  X-ray on [facility]   Radiograph demonstrates well-positioned bone cement at the T8 vertebra with new 30% height loss vertebral fracture at T9.  I believe this is the basis for this patient's sudden worsening of pain.  Based on the imaging studies I have recommended that we proceed with T9 and T10 kyphoplasty procedure.  We also discussed the need for management of osteoporosis from medical standpoint as this has not been done and I would like for the patient to see Dr. Chalmers Cater and to get a new bone density study.  Because the patient is on Xarelto, he will need to be off the medication and we will proceed with kyphoplasty procedure the release possible date which will be 05/13/13.  That will give him 5 days off of blood thinners.  The patient did very well with previous kyphoplasty in terms of anesthesia which was done with sedation rather than general anesthesia and I believe this is the best way to proceed given his multiple medical comorbidities and risks with anesthesia.      Medical/Surgical/Interim History Reviewed, no change.  Last detailed document date:04/13/2013.   PAST MEDICAL HISTORY, SURGICAL HISTORY, FAMILY HISTORY, SOCIAL HISTORY AND REVIEW OF SYSTEMS I have reviewed the patient's past medical, surgical, family and social history as well as the comprehensive review of systems as  included on the [facility] history form dated, which I have signed.  Family History: Reviewed, no changes.  Last detailed document: 04/13/2013.  Patient reports there is no relevant family history.   Social History: Tobacco use reviewed. Reviewed, no changes. Last detailed document date: 04/13/2013.      MEDICATIONS(added, continued or stopped this visit):   Started Medication Directions Instruction Stopped   allopurinol 100 mg tablet take 1 tablet by oral route  every day     amlodipine 5 mg tablet take 1 tablet by oral route  every day    04/13/2013 Dilaudid 2 mg tablet take 1 tablet by oral route  every 4 - 6 hours as needed  05/09/2013  05/09/2013 Dilaudid 2 mg tablet take 1 tablet by oral route  every 4 - 6 hours as needed     finasteride 5 mg tablet take 1 tablet by oral route  every day    11/17/2012 Flexeril 5 mg tablet take 1 tablet by oral route 3 times every day     Flomax 0.4 mg capsule take 1 capsule by oral route  every day 1/2 hour following the same meal each day    10/21/2012 hydrocodone 5 mg-acetaminophen 325 mg tablet take 1  tablet by oral route  every 12 hours as needed for pain     Klor-Con M20 mEq tablet,extended release take 2 tablet by oral route 2 times every day with food     pantoprazole 40 mg tablet,delayed release take 1 tablet  by oral route  every day     prednisone 5 mg tablet 2 tablets daily     Remeron 15 mg tablet take 1 tablet by oral route  every day before bedtime     warfarin 2.5 mg tablet take 1 tablet by oral route 2 times every day      ALLERGIES:  Ingredient Reaction Medication Name Comment  CEPHALEXIN MONOHYDRATE  KEFLEX   POTASSIUM CLAVULANATE  AUGMENTIN   CODEINE     AMOXICILLIN TRIHYDRATE  AUGMENTIN   TRIMETHOPRIM     Reviewed, no changes.   Vitals Date Temp F BP Pulse Ht In Wt Lb BMI BSA Pain Score  05/09/2013  125/80 85 73 215 28.37  10/10        IMPRESSION New compression fracture T9  with vertebral body height loss.  Comments:  Off xarelto  Completed Orders (this encounter) Order Details Reason Side Interpretation Result Initial Treatment Date Region  Lifestyle education regarding diet Encouraged to eat a well balanced diet and follow up with primary care physician.        Thoracic Spine- AP/Lat      05/09/2013    Assessment/Plan # Detail Type Description   1. Assessment BMI 28.0-28.9,ADULT (V85.24).   Plan Orders Today's instructions / counseling include(s) Lifestyle education regarding diet.       2. Assessment Closed fracture of thoracic vertebra (805.2).   Plan Orders Referral to [facility]       3. Assessment Kyphosis of thoracolumbar region (737.10).       4. Assessment Thoracic back pain (724.1).       5. Assessment Thoracic radiculopathy (724.4).         Pain Assessment/Treatment Pain Scale: 10/10. Method: Numeric Pain Intensity Scale. Location: right side. Onset: 12/04/2012. Duration: varies. Quality: sharp. Pain Assessment/Treatment follow-up plan of care: Patient currently taking Dilaudid for pain..  Fall Risk Plan The patient has not fallen in the last year.  Kyphoplasty T9 and T10 (I will be treating T10 because this will be the only intervening vertebral body from T8 to L1 levels and I told the patient I did not want to do the T9 kyphoplasty only to have him return for kyphoplasty of T10, which represents significant risks given his multiple vertebral body compression fractures).  The patient will also see Dr. Chalmers Cater, endocrinologist, for evaluation of osteoporosis with medical management.  Orders: Diagnostic Procedures: Assessment Procedure  724.1 Thoracic Spine- AP/Lat  805.2 Kyphoplasty T 9 and  T 10 levels  Instruction(s)/Education: Assessment Instruction  V85.24 Lifestyle education regarding diet    MEDICATIONS PRESCRIBED TODAY    Rx Quantity Refills  DILAUDID 2 mg  60 0            Provider:  Marchia Meiers. Vertell Limber MD   05/11/2013 08:31 AM Dictation edited by: Marchia Meiers. Vertell Limber    CC Providers: Erline Levine MD [address] [place], [place] [address]  ----------------------------------------------------------------------------------------------------------------------------------------------------------------------         Electronically signed by Marchia Meiers. Vertell Limber MD on 05/11/2013 08:32 AM

## 2013-05-13 NOTE — Progress Notes (Signed)
Dr. Oletta Lamas notified of PO intake.

## 2013-05-13 NOTE — Brief Op Note (Signed)
05/13/2013  8:00 PM  PATIENT:  Christopher Marshall  79 y.o. male  PRE-OPERATIVE DIAGNOSIS:  Thoracic fracture T 9 and T 10  POST-OPERATIVE DIAGNOSIS:  Thoracic fracture T 9 and T 10  PROCEDURE:  Procedure(s) with comments: Thoracic Nine and Thoracic Ten KYPHOPLASTY (N/A) - Thoracic Nine and Thoracic Ten KYPHOPLASTY  SURGEON:  Surgeon(s) and Role:    * Zoraya Fiorenza, MD - Primary  PHYSICIAN ASSISTANT:   ASSISTANTS: None   ANESTHESIA:   IV sedation  EBL:  Total I/O In: 100 [I.V.:100] Out: -   BLOOD ADMINISTERED:none  DRAINS: none   LOCAL MEDICATIONS USED:  LIDOCAINE   SPECIMEN:  No Specimen  DISPOSITION OF SPECIMEN:  N/A  COUNTS:  YES  TOURNIQUET:  * No tourniquets in log *  DICTATION: DICTATION: Patient is 79 year old man with osteoporosis and multi-level thoracolumbar compression fractures with intractable pain..   It was elected to take him to surgery for kyphoplasty procedure of T 9 and T 10 levels.  PROCEDURE:  Following the initiation of intravenous sedation monitored by  anesthesia, the patient was placed in a prone position on chest rolls.  C-arm fluoroscopy was positioned in both the AP and lateral planes, centered on the L T 9 and T 10  vertebrae.  His back was prepped and draped in the usual sterile fashion with Duraprep.  Using a right uni-pedicular approach, the right T 9 pedicle and vertebral body were entered with the trochar using standard landmarks.  The drill was used, followed by a 15 cc Kyphon balloon, which was used to re-expand the broken vertebra.  Subsequently, 6 cc of bone cement was placed into the void created by the balloon and was seen to fill the fracture cleft and fill the vertebra in both the AP and lateral direction with good interdigitation and without apparent extravasation. The bone void filler was then removed.  Using a right uni-pedicular approach, the right T 10 pedicle and vertebral body were entered with the trochar using standard landmarks.   The drill was used, followed by a 15 cc Kyphon balloon, which was used to re-expand the broken vertebra.  Subsequently, 8 cc of bone cement was placed into the void created by the balloon and was seen to fill the fracture cleft and fill the vertebra in both the AP and lateral direction with good interdigitation and without apparent extravasation. The bone void filler was then removed. Final X-ray demonstrated good filling within the fractured vertebra.  The incision was closed with a single 3-0 vicryl stitch at each incision and dressed with Dermabond. The patient was returned to the OR gurney and taken to Recovery in stable and satisfactory condition, having tolerated the procedure well.  Counts were correct at the end of the case.   PLAN OF CARE: Admit for overnight observation  PATIENT DISPOSITION:  PACU - hemodynamically stable.   Delay start of Pharmacological VTE agent (>24hrs) due to surgical blood loss or risk of bleeding: yes  

## 2013-05-13 NOTE — Interval H&P Note (Signed)
History and Physical Interval Note:  05/13/2013 11:03 AM  Christopher Marshall  has presented today for surgery, with the diagnosis of Thoracic fracture  The various methods of treatment have been discussed with the patient and family. After consideration of risks, benefits and other options for treatment, the patient has consented to  Procedure(s) with comments: T9 T10 KYPHOPLASTY (N/A) - T9 T10 KYPHOPLASTY as a surgical intervention .  The patient's history has been reviewed, patient examined, no change in status, stable for surgery.  I have reviewed the patient's chart and labs.  Questions were answered to the patient's satisfaction.     Erline Levine

## 2013-05-14 MED ORDER — PANTOPRAZOLE SODIUM 40 MG PO TBEC
40.0000 mg | DELAYED_RELEASE_TABLET | Freq: Every day | ORAL | Status: DC
  Administered 2013-05-14 – 2013-05-18 (×5): 40 mg via ORAL
  Filled 2013-05-14 (×4): qty 1

## 2013-05-14 MED ORDER — RIVAROXABAN 15 MG PO TABS
15.0000 mg | ORAL_TABLET | Freq: Every day | ORAL | Status: DC
  Administered 2013-05-15 – 2013-05-17 (×3): 15 mg via ORAL
  Filled 2013-05-14 (×5): qty 1

## 2013-05-14 NOTE — Evaluation (Signed)
Occupational Therapy Evaluation Patient Details Name: Christopher Marshall MRN: [id number] DOB: 1934/10/13 Today's Date: 05/14/2013    History of Present Illness Patient is a 79 yo male admitted with back pain from T9 compression fracture.  Patient s/p T9-10 kyphoplasty.  Patient with h/o T8 kyphoplasty.   Clinical Impression   Patient is s/p T9-10 kyphoplasty surgery resulting in functional limitations due to the deficits listed below (see OT problem list).  Patient will benefit from skilled OT acutely to increase independence and safety with ADLS to allow discharge [place]. Ot returning for second session with PT 13:00pm and patient demonstrates ability to complete basic transfer to bathroom and at a level family can manage at home. Recommendation at this time is home. Ot to continue to see patient.     Follow Up Recommendations  Home health OT    Equipment Recommendations       Recommendations for Other Services       Precautions / Restrictions Precautions Precautions: Fall Restrictions Weight Bearing Restrictions: No      Mobility Bed Mobility               General bed mobility comments: not assessed pt declines x2 attempts 2 sessions  Transfers Overall transfer level: Needs assistance Equipment used: Rolling walker (2 wheeled) Transfers: Sit to/from Stand Sit to Stand: Min assist;+2 physical assistance         General transfer comment: Verbal cues for hand placement.  Assist to rise to standing and for balance.    Balance Overall balance assessment: Needs assistance                             High Level Balance Comments: pt needs cues for upright posture. Pt with posture lean static sitting            ADL Overall ADL's : Needs assistance/impaired Eating/Feeding: Set up   Grooming: Wash/dry hands;Wash/dry face;Set up;Sitting   Upper Body Bathing: Moderate assistance;Sitting   Lower Body Bathing: Total assistance       Lower Body  Dressing: Total assistance   Toilet Transfer: Minimal assistance;Ambulation;BSC;RW           Functional mobility during ADLs: Minimal assistance;Rolling walker General ADL Comments: Pt requires total (A) at baseline for LB dressing / bathing. pt requires (A) with transfers at this time and baseline PTA was completing MOD I.      Vision                     Perception     Praxis      Pertinent Vitals/Pain Reports pain unable to rate - requesting pain medication Rn to address     Hand Dominance Right   Extremity/Trunk Assessment Upper Extremity Assessment Upper Extremity Assessment: Generalized weakness   Lower Extremity Assessment Lower Extremity Assessment: Generalized weakness   Cervical / Trunk Assessment Cervical / Trunk Assessment: Kyphotic (Mid trunk)   Communication Communication Communication: No difficulties   Cognition Arousal/Alertness: Awake/alert Behavior During Therapy: WFL for tasks assessed/performed Overall Cognitive Status: Impaired/Different from baseline Area of Impairment: Orientation;Problem solving Orientation Level: Disoriented to;Time   Memory: Decreased short-term memory       Problem Solving: Slow processing General Comments: Pt unable to read clock time, visual daylight and with verbal questioning cue select appropriate approaching meal ( breakfast lunch or dinner) Pt states "i dont know she ordered it"  "whatever tastes good" "whatever comes to me".  General Comments       Exercises       Shoulder Instructions      Home Living Family/patient expects to be discharged to:: Private residence Living Arrangements: Spouse/significant other;Children Available Help at Discharge: Family;Available 24 hours/day Type of Home: House Home Access: Stairs to enter [employer] of Steps: 1 small Entrance Stairs-Rails: None Home Layout: One level;Laundry or work area in [place]: Handicapped  [place]: [place];Cane - single point;Walker - 2 wheels;Other (comment) (Takes sponge baths; has raised toilet) Adaptive Equipment: Reacher;Sock aid;Long-handled shoe horn;Long-handled sponge Additional Comments: lift chair for sit<>stand. pt currently sleeps in recliner. MD has per wife advised against sleeping in recliner and pt is non complaint      Prior Functioning/Environment Level of Independence: Independent with assistive device(s)        Comments: Needs some assist with sit to stand at times    OT Diagnosis: Generalized weakness;Acute pain;Cognitive deficits   OT Problem List: Decreased activity tolerance;Decreased strength;Impaired balance (sitting and/or standing);Decreased knowledge of use of DME or AE;Decreased safety awareness;Decreased knowledge of precautions;Pain   OT Treatment/Interventions: Self-care/ADL training    OT Goals(Current goals can be found in the care plan section) Acute Rehab OT Goals Patient Stated Goal: To get stronger OT Goal Formulation: With patient/family Time For Goal Achievement: 05/28/13 Potential to Achieve Goals: Good  OT Frequency: Min 2X/week   Barriers to D/C:            Co-evaluation              End of Session Equipment Utilized During Treatment: Rolling walker Nurse Communication: Mobility status;Precautions  Activity Tolerance: Patient limited by lethargy Patient left: in chair;with call bell/phone within reach   Time: 1133-1208 OT Time Calculation (min): 35 min / second visit with PT however not seen for >23 minutes so only charged for one session Charges:  OT General Charges $OT Visit: 1 Procedure OT Evaluation $Initial OT Evaluation Tier I: 1 Procedure OT Treatments $Self Care/Home Management : 23-37 mins  Pt only charged for 1 visit due to time spent in room but seen x2 times G-Codes:    Peri Maris 06/06/2013, 4:36 PM Pager: [phone]

## 2013-05-14 NOTE — Progress Notes (Signed)
Utilization Review Completed.  

## 2013-05-14 NOTE — Evaluation (Signed)
Physical Therapy Evaluation Patient Details Name: Christopher Marshall MRN: [id number] DOB: Apr 25, 1934 Today's Date: 05/14/2013   History of Present Illness  Patient is a 79 yo male admitted with back pain from T9 compression fracture.  Patient s/p T9-10 kyphoplasty.  Patient with h/o T8 kyphoplasty.  Clinical Impression  Patient presents with problems listed below.  Will benefit from acute PT to maximize independence prior to discharge home with family.  Will need f/u HHPT to continue therapy at discharge.    Follow Up Recommendations Home health PT;Supervision/Assistance - 24 hour [facility] Aide for ADL's)    Equipment Recommendations  None recommended by PT    Recommendations for Other Services       Precautions / Restrictions Precautions Precautions: Fall Restrictions Weight Bearing Restrictions: No      Mobility  Bed Mobility                  Transfers Overall transfer level: Needs assistance Equipment used: Rolling walker (2 wheeled) Transfers: Sit to/from Stand Sit to Stand: Min assist;+2 physical assistance         General transfer comment: Verbal cues for hand placement.  Assist to rise to standing and for balance.  Ambulation/Gait Ambulation/Gait assistance: Min assist;+2 safety/equipment Ambulation Distance (Feet): 150 Feet (with 1 standing rest break) Assistive device: Rolling walker (2 wheeled) Gait Pattern/deviations: Step-through pattern;Decreased stride length;Trunk flexed Gait velocity: Slow Gait velocity interpretation: Below normal speed for age/gender General Gait Details: Verbal cues for safe use of RW.  Cues to keep RW close to body.  Cues to stand upright and look forward during gait.  Stairs            Wheelchair Mobility    Modified Rankin (Stroke Patients Only)       Balance                                             Pertinent Vitals/Pain O2 sats 90-92% with ambulation on O2 at 2 l/min. Pain at 6/10 with  ambulation    Home Living Family/patient expects to be discharged to:: Private residence Living Arrangements: Spouse/significant other;Children Available Help at Discharge: Family;Available 24 hours/day Type of Home: House Home Access: Stairs to enter Entrance Stairs-Rails: None Entrance Stairs-Number of Steps: 1 small Home Layout: One level;Laundry or work area in [employer]: [place];Cane - single point;Walker - 2 wheels (Takes sponge baths; has raised toilet)      Prior Function Level of Independence: Independent with assistive device(s)         Comments: Needs some assist with sit to stand at times     Hand Dominance   Dominant Hand: Right    Extremity/Trunk Assessment   Upper Extremity Assessment: Generalized weakness;Defer to OT evaluation           Lower Extremity Assessment: Generalized weakness      Cervical / Trunk Assessment: Kyphotic (Mid trunk)  Communication   Communication: No difficulties  Cognition Arousal/Alertness: Awake/alert Behavior During Therapy: WFL for tasks assessed/performed Overall Cognitive Status: Within Functional Limits for tasks assessed                      General Comments      Exercises        Assessment/Plan    PT Assessment Patient needs continued PT services  PT Diagnosis Difficulty  walking;Generalized weakness;Acute pain   PT Problem List Decreased strength;Decreased activity tolerance;Decreased balance;Decreased mobility;Decreased knowledge of use of DME;Cardiopulmonary status limiting activity;Pain  PT Treatment Interventions DME instruction;Gait training;Functional mobility training;Patient/family education   PT Goals (Current goals can be found in the Care Plan section) Acute Rehab PT Goals Patient Stated Goal: To get stronger PT Goal Formulation: With patient/family Time For Goal Achievement: 05/21/13 Potential to Achieve Goals: Good    Frequency Min 3X/week    Barriers to discharge        Co-evaluation               End of Session Equipment Utilized During Treatment: Gait belt;Oxygen Activity Tolerance: Patient limited by fatigue;Patient limited by pain Patient left: in chair;with call bell/phone within reach;with family/visitor present Nurse Communication: Mobility status         Time: 5916-3846 PT Time Calculation (min): 14 min   Charges:   PT Evaluation $Initial PT Evaluation Tier I: 1 Procedure PT Treatments $Gait Training: 8-22 mins   PT G Codes:          Despina Pole 2013-05-28, 2:34 PM Carita Pian. Sanjuana Kava, [place] Pager [phone]

## 2013-05-14 NOTE — Progress Notes (Signed)
Subjective: Patient reports still uncomfortable, but overall less back pain than preop.  Objective: Vital signs in last 24 hours: Temp:  [97.3 F (36.3 C)-98.1 F (36.7 C)] 98 F (36.7 C) (04/11 0536) Pulse Rate:  [75-100] 82 (04/11 0943) Resp:  [15-23] 18 (04/11 0536) BP: (101-144)/(66-99) 101/66 mmHg (04/11 0943) SpO2:  [92 %-98 %] 92 % (04/11 0943) Weight:  [96.163 kg (212 lb)] 96.163 kg (212 lb) (04/10 1515)  Intake/Output from previous day: 04/10 0701 - 04/11 0700 In: 100 [I.V.:100] Out: 730 [Urine:730] Intake/Output this shift:    Physical Exam: Dressings CDI.  Sitting up in chair.  Lab Results:  Recent Labs  05/13/13 1506  WBC 8.8  HGB 11.2*  HCT 33.8*  PLT 218   BMET  Recent Labs  05/13/13 1506  NA 138  K 4.8  CL 97  CO2 21  GLUCOSE 114*  BUN 51*  CREATININE 3.04*  CALCIUM 9.4    Studies/Results: Dg Thoracic Spine 2 View  05/14/2013   CLINICAL DATA:  Kyphoplasty procedure  EXAM: DG C-ARM 1-60 MIN; THORACIC SPINE - 2 VIEW  : COMPARISON:  04/26/2013  FINDINGS: Kyphoplasty performed at multiple levels. Only 1 level was treated on the prior exam. Kyphoplasty cement appears well positioned within the vertebral bodies on the submitted images.  IMPRESSION: Multilevel kyphoplasty. Please refer to the procedure report for complete description.   Electronically Signed   By: Lajean Manes M.D.   On: 05/14/2013 07:26   Dg C-arm 1-60 Min  05/14/2013   CLINICAL DATA:  Kyphoplasty procedure  EXAM: DG C-ARM 1-60 MIN; THORACIC SPINE - 2 VIEW  : COMPARISON:  04/26/2013  FINDINGS: Kyphoplasty performed at multiple levels. Only 1 level was treated on the prior exam. Kyphoplasty cement appears well positioned within the vertebral bodies on the submitted images.  IMPRESSION: Multilevel kyphoplasty. Please refer to the procedure report for complete description.   Electronically Signed   By: Lajean Manes M.D.   On: 05/14/2013 07:26    Assessment/Plan: Will need to mobilize  today with PT.  Will make sure adequate pain control.    LOS: 1 day    Erline Levine, MD 05/14/2013, 10:47 AM

## 2013-05-15 NOTE — Progress Notes (Addendum)
Physical Therapy Treatment Patient Details Name: Christopher Marshall MRN: [id number] DOB: [birth year] Today's Date: 06/06/13    History of Present Illness Patient is a 79 yo male admitted with back pain from T9 compression fracture.  Patient s/p T9-10 kyphoplasty.  Patient with h/o T8 kyphoplasty.    PT Comments    Patient making progress with mobility and gait.  Patient more alert this pm.  Per family, patient going to SNF at discharge for continued therapy - good, safe d/c plan.  Agree with need for SNF (patient refusing previously).  Follow Up Recommendations  SNF;Supervision/Assistance - 24 hour (Per family, patient now going to SNF)     Equipment Recommendations  None recommended by PT    Recommendations for Other Services       Precautions / Restrictions Precautions Precautions: Fall Restrictions Weight Bearing Restrictions: No    Mobility  Bed Mobility                  Transfers Overall transfer level: Needs assistance Equipment used: Rolling walker (2 wheeled) Transfers: Sit to/from Stand Sit to Stand: Min assist;+2 safety/equipment         General transfer comment: Verbal cues for hand placement.  Assist to rise to standing and for balance.  Ambulation/Gait Ambulation/Gait assistance: Min assist;+2 safety/equipment Ambulation Distance (Feet): 150 Feet Assistive device: Rolling walker (2 wheeled) Gait Pattern/deviations: Step-through pattern;Decreased stride length;Trunk flexed Gait velocity: Slow Gait velocity interpretation: Below normal speed for age/gender General Gait Details: Verbal cues for safe use of RW.  Cues to keep RW close to body.  Cues to stand upright and look forward during gait.   Stairs            Wheelchair Mobility    Modified Rankin (Stroke Patients Only)       Balance Overall balance assessment: Needs assistance         Standing balance support: Bilateral upper extremity supported Standing balance-Leahy  Scale: Poor                      Cognition Arousal/Alertness: Awake/alert Behavior During Therapy: WFL for tasks assessed/performed Overall Cognitive Status: Within Functional Limits for tasks assessed                      Exercises      General Comments        Pertinent Vitals/Pain Pain 9/10 in back and Rt side    Home Living                      Prior Function            PT Goals (current goals can now be found in the care plan section) Progress towards PT goals: Progressing toward goals    Frequency  Min 3X/week    PT Plan Discharge plan needs to be updated    Co-evaluation             End of Session Equipment Utilized During Treatment: Gait belt;Oxygen Activity Tolerance: Patient limited by pain;Patient limited by fatigue Patient left: in chair;with call bell/phone within reach;with chair alarm set;with family/visitor present     Time: 6759-1638 PT Time Calculation (min): 23 min  Charges:  $Gait Training: 23-37 mins                    G Codes:      Despina Pole 06-06-2013, 4:19 PM Carita Pian. Rosana Hoes,  PT, [place] Pager [phone]

## 2013-05-15 NOTE — Progress Notes (Signed)
Patient continues to complain of significant pain. Difficult for him to find a comfortable position either lying or sitting. Patient's confusion has improved overnight after stopping Neurontin. Neither he nor his wife are comfortable with thought of discharge home today.  Afebrile. Vitals are stable. Urine output good. Awake and aware. Recently appropriate. Motor and sensory function of the extremities stable.  Status post vertebroplasty with continued significant back pain and very poor mobility. Patient will likely need skilled nursing facility placement for pain control and further convalescence.

## 2013-05-15 NOTE — Progress Notes (Signed)
Family was concerned with patient being lethargic all afternoon and question him being on neurontin , stated ever since he has been on it his speech is slurred and not as alert. Also concerned about restarting his xarelto. Called the on call MD and telephone orders were placed to DC the neurontin and start xarelto 15mg  PO Qday beginning tomorrow morning. Patient would not answer nurses neuro questions, however was alert and would follow commands. Patient would only whisper "please". Will continue to monitor

## 2013-05-15 NOTE — Progress Notes (Signed)
Patient says pain is better when sitting in the recliner.  Encouraged patient to ambulate every hour.  Patient ambulating with the help of a front wheel walker-tolerating well. Kizzie Bane, RN

## 2013-05-16 ENCOUNTER — Inpatient Hospital Stay (HOSPITAL_COMMUNITY): Payer: Medicare Other

## 2013-05-16 DIAGNOSIS — M109 Gout, unspecified: Secondary | ICD-10-CM

## 2013-05-16 DIAGNOSIS — R609 Edema, unspecified: Secondary | ICD-10-CM

## 2013-05-16 DIAGNOSIS — I4891 Unspecified atrial fibrillation: Secondary | ICD-10-CM | POA: Diagnosis present

## 2013-05-16 DIAGNOSIS — M8448XA Pathological fracture, other site, initial encounter for fracture: Principal | ICD-10-CM

## 2013-05-16 DIAGNOSIS — N400 Enlarged prostate without lower urinary tract symptoms: Secondary | ICD-10-CM

## 2013-05-16 DIAGNOSIS — N184 Chronic kidney disease, stage 4 (severe): Secondary | ICD-10-CM

## 2013-05-16 LAB — COMPREHENSIVE METABOLIC PANEL
ALT: 10 U/L (ref 0–53)
AST: 17 U/L (ref 0–37)
Albumin: 2.7 g/dL — ABNORMAL LOW (ref 3.5–5.2)
Alkaline Phosphatase: 108 U/L (ref 39–117)
BILIRUBIN TOTAL: 0.4 mg/dL (ref 0.3–1.2)
BUN: 52 mg/dL — AB (ref 6–23)
CHLORIDE: 99 meq/L (ref 96–112)
CO2: 25 meq/L (ref 19–32)
CREATININE: 3.35 mg/dL — AB (ref 0.50–1.35)
Calcium: 9 mg/dL (ref 8.4–10.5)
GFR, EST AFRICAN AMERICAN: 19 mL/min — AB (ref >90)
GFR, EST NON AFRICAN AMERICAN: 16 mL/min — AB (ref >90)
GLUCOSE: 141 mg/dL — AB (ref 70–99)
Potassium: 4.3 mEq/L (ref 3.7–5.3)
Sodium: 139 mEq/L (ref 137–147)
Total Protein: 6.5 g/dL (ref 6.0–8.3)

## 2013-05-16 LAB — PRO B NATRIURETIC PEPTIDE: PRO B NATRI PEPTIDE: 6872 pg/mL — AB (ref 0–450)

## 2013-05-16 MED ORDER — FUROSEMIDE 10 MG/ML IJ SOLN
40.0000 mg | Freq: Every day | INTRAMUSCULAR | Status: DC
  Administered 2013-05-16 – 2013-05-18 (×3): 40 mg via INTRAVENOUS
  Filled 2013-05-16 (×3): qty 4

## 2013-05-16 MED ORDER — METOPROLOL TARTRATE 12.5 MG HALF TABLET
12.5000 mg | ORAL_TABLET | Freq: Two times a day (BID) | ORAL | Status: DC
  Filled 2013-05-16 (×2): qty 1

## 2013-05-16 MED ORDER — HYDROCERIN EX CREA
TOPICAL_CREAM | Freq: Two times a day (BID) | CUTANEOUS | Status: DC
  Administered 2013-05-16: 1 via TOPICAL
  Administered 2013-05-16 – 2013-05-18 (×4): via TOPICAL
  Filled 2013-05-16: qty 113

## 2013-05-16 MED ORDER — METOPROLOL TARTRATE 12.5 MG HALF TABLET
12.5000 mg | ORAL_TABLET | Freq: Two times a day (BID) | ORAL | Status: DC
  Administered 2013-05-16 – 2013-05-18 (×4): 12.5 mg via ORAL
  Filled 2013-05-16 (×6): qty 1

## 2013-05-16 NOTE — Consult Note (Addendum)
Medical Consultation   Christopher Marshall  YWV:[id number]  DOB: Jun 26, 1934  DOA: 05/13/2013  PCP: Quentin Cornwall, MD  Requesting physician: Dr. Vertell Limber, neurosurgery  Reason for consultation: Lower extremity edema  History of Present Illness: This is a 79 year old male with a history of atrial fibrillation, DVT, arthritis, thoracic compression fractures that has undergone kyphoplasty of T9-T10 by neurosurgery. Patient currently has lower extremity edema. Per patient and his wife who is at bedside, patient does not have any history of congestive heart failure, although has a history of atrial fibrillation and DVT. [facility] was consulted for medical management of lower extremity edema. Patient currently denies any shortness of breath and states he feels he is at baseline. He does complain of some side pain however states his back pain has subsided. Patient states he cannot lay down or sit with his legs elevated, as this causes a lot of back and nerve pain. At this time patient denies any chest pain, dizziness, abdominal pain.  Allergies:   Allergies  Allergen Reactions  . Amoxicillin Other (See Comments)    Unknown   . Cephalexin Other (See Comments)    unknown  . Cephalosporins Other (See Comments)    Unknown   . Codeine Other (See Comments)    Unknown   . Trimethoprim Other (See Comments)    Unknown       Past Medical History  Diagnosis Date  . DVT (deep venous thrombosis)     both legs  . Gout   . Arthritis     osteo and saroactic  . Family history of anesthesia complication     Daughter- N/V  . Peripheral vascular disease   . Anxiety   . Depression     per family  . Perforated diverticulum of duodenum 09/2011  . BPH (benign prostatic hyperplasia)   . GERD (gastroesophageal reflux disease)   . Hernia of abdominal wall   . Cancer     Skin cancerrighgt arm- basal  . History of blood transfusion     hip surgery  . Rotator cuff injury     left  . Chronic kidney disease      Stage IV CKD; Dr. Dolores Frame at [facility] in [place]  . Dysrhythmia     irregular one time  . A-fib     Dr. Donn Pierini in [place]  . Hypertension   . Shortness of breath   . Complication of anesthesia     BP dropped with last surgery in 12/14    Past Surgical History  Procedure Laterality Date  . Colostomy    . Hernia repair Bilateral     inguinal  . Eye surgery Bilateral 2010    Cataract  . Abdominal hernia repair    . Kyphoplasty  12/2011  . Joint replacement Right 2004    hip  . Kyphoplasty N/A 01/07/2013    Procedure: T11-T12 Kyphoplasty;  Surgeon: Erline Levine, MD;  Location: [facility];  Service: Neurosurgery;  Laterality: N/A;  T11-T12 Kyphoplasty  . Kyphoplasty N/A 04/26/2013    Procedure: Thoracic Eight KYPHOPLASTY;  Surgeon: Erline Levine, MD;  Location: [facility];  Service: Neurosurgery;  Laterality: N/A;  Thoracic Eight KYPHOPLASTY    Social History:  reports that he has never smoked. He has never used smokeless tobacco. He reports that he does not drink alcohol or use illicit drugs.  Family History  Problem Relation Age of Onset  . Cancer - Other Mother   . Heart disease Sister   .  Heart disease Brother   . Heart disease Brother     Review of Systems:  Review of Systems:  Constitutional: Denies fever, chills, diaphoresis, appetite change and fatigue.  HEENT: Denies photophobia, eye pain, redness, hearing loss, ear pain, congestion, sore throat, rhinorrhea, sneezing, mouth sores, trouble swallowing, neck pain, neck stiffness and tinnitus.   Respiratory: Denies SOB, DOE, cough, chest tightness,  and wheezing.   Cardiovascular: Denies chest pain, palpitations and leg swelling.  Gastrointestinal: Denies nausea, vomiting, abdominal pain, diarrhea, constipation, blood in stool and abdominal distention.  Genitourinary: Denies dysuria, urgency, frequency, hematuria, flank pain and difficulty urinating.  Musculoskeletal: Denies myalgias, back  pain, joint swelling, arthralgias and gait problem.  Skin: Denies pallor, rash and wound.  Neurological: Denies dizziness, seizures, syncope, weakness, light-headedness, numbness and headaches.  Hematological: Denies adenopathy. Easy bruising, personal or family bleeding history  Psychiatric/Behavioral: Denies suicidal ideation, mood changes, confusion, nervousness, sleep disturbance and agitation   Physical Exam: Blood pressure 92/50, pulse 100, temperature 97.9 F (36.6 C), temperature source Oral, resp. rate 20, height 6\' 1"  (1.854 m), weight 96.163 kg (212 lb), SpO2 97.00%.   General: Well developed, well nourished, NAD, appears stated age  HEENT: NCAT, PERRLA, EOMI, Anicteic Sclera, mucous membranes moist.   Neck: Supple, no JVD, no masses  Cardiovascular: S1 S2 auscultated, no rubs, murmurs or gallops. Irregular  Respiratory: Diminished breath sounds, some rales noted in lower lung fields  Abdomen: Soft, obese, nontender, nondistended, + bowel sounds  Extremities: warm dry without cyanosis clubbing. 3+ pitting edema in the lower ext bilaterally- up to the knee  Neuro: AAOx3, cranial nerves grossly intact. Strength equal and bilateral upper and lower extremities.  Skin: Without rashes exudates or nodules, venous stasis skin changes on LE b/l  Psych: Normal affect and demeanor with intact judgement and insight  Labs on Admission:  Basic Metabolic Panel:  Recent Labs Lab 05/13/13 1506  NA 138  K 4.8  CL 97  CO2 21  GLUCOSE 114*  BUN 51*  CREATININE 3.04*  CALCIUM 9.4   Liver Function Tests: No results found for this basename: AST, ALT, ALKPHOS, BILITOT, PROT, ALBUMIN,  in the last 168 hours No results found for this basename: LIPASE, AMYLASE,  in the last 168 hours No results found for this basename: AMMONIA,  in the last 168 hours CBC:  Recent Labs Lab 05/13/13 1506  WBC 8.8  HGB 11.2*  HCT 33.8*  MCV 92.6  PLT 218   Cardiac Enzymes: No results found  for this basename: CKTOTAL, CKMB, CKMBINDEX, TROPONINI,  in the last 168 hours BNP: No components found with this basename: POCBNP,  CBG: No results found for this basename: GLUCAP,  in the last 168 hours  Inpatient Medications:   Scheduled Meds: . allopurinol  100 mg Oral Daily  . cholecalciferol  1,000 Units Oral Daily  . diltiazem  240 mg Oral Daily  . docusate sodium  100 mg Oral BID  . ferrous gluconate  324 mg Oral Q breakfast  . finasteride  5 mg Oral Daily  . furosemide  40 mg Oral Daily  . levothyroxine  75 mcg Oral QAC breakfast  . mirtazapine  15 mg Oral QHS  . pantoprazole  40 mg Oral Q1200  . predniSONE  7.5 mg Oral Q breakfast  . rivaroxaban  15 mg Oral Q supper  . senna  1 tablet Oral BID  . sodium chloride  3 mL Intravenous Q12H  . tamsulosin  0.4 mg Oral QHS  Continuous Infusions: . sodium chloride    . dextrose 5 % and 0.45 % NaCl with KCl 20 mEq/L 75 mL/hr at 05/13/13 2252   Radiological Exams on Admission: No results found.  Impression/Recommendations Pathologic compression fracture of thoracic vertebra, T9-10 -POD 3, s/p kyphoplasty -Neurosurgery following -Continue pain control -PT consulted, recommended SNF  Lower extremity edema Will call his cardiologist, Dr. Sondra Come in [place], [place] to obtain recent echocardiogram results.  Will also order chest xray and BNP.  Patient appears to fluid overloaded, however, in the setting of CKD, Stage 4.  Will add fluid restriction to diet, monitor daily weights, strict intake and output.  Will place on lasix 40mg  IV BID.  Keep legs elevated.  Will consult wound care for weeping.   Atrial Fibrilliation Rate controlled.  Continue Xarelto and diltiazem.  History of DVT Patient developed DVT while on coumadin therapy and was switched to Xarelto, per his nephrologist in [place], [place].  Hypertension Controlled, continue lasix.   CKD, stage 4 Creatinine appears to be at baseline.  Spoke with nephrologist in [place],  [place].  Creatinine baseline is 3.1 to 3.3.  He has already had venous mapping.    Hypothyroidism Continue synthroid.   Gout Continue allopurinol.  GERD Continue PPI.  BPH Continue Flomax/Proscar  I will followup again in the morning. Please contact me if I can be of assistance in the meanwhile. Thank you for this consultation.  Time Spent: 70 minutes  Heyden Jaber D.O. [facility] 05/16/2013, 8:45 AM

## 2013-05-16 NOTE — Clinical Documentation Improvement (Signed)
Possible Clinical Conditions?   _______CKD Stage III - GFR 30-59 _______CKD Stage IV - GFR 15-29 _______CKD Stage V - GFR < 15 _______ESRD (End Stage Renal Disease) _______Other condition _______Cannot Clinically determine     Risk Factors:  Past medical history of CKD stage IV per 4/13 progress notes.  Diagnostics:  4/10:  Bun: 51 4/10:  Creat: 3.04 4/10: GFR: 19   Thank You, Mordecai Rasmussen Documentation Specialist:  [phone]  [facility] [employer] [facility]

## 2013-05-16 NOTE — Progress Notes (Signed)
UR COMPLETED  

## 2013-05-16 NOTE — Progress Notes (Signed)
ANTICOAGULATION CONSULT NOTE - Initial Consult  Pharmacy Consult for Xarelto Indication: atrial fibrillation  Allergies  Allergen Reactions  . Amoxicillin Other (See Comments)    Unknown   . Cephalexin Other (See Comments)    unknown  . Cephalosporins Other (See Comments)    Unknown   . Codeine Other (See Comments)    Unknown   . Trimethoprim Other (See Comments)    Unknown     Patient Measurements: Height: 6\' 1"  (185.4 cm) Weight: 225 lb 6.4 oz (102.241 kg) IBW/kg (Calculated) : 79.9   Vital Signs: Temp: 98.1 F (36.7 C) (04/13 1042) Temp src: Oral (04/13 1042) BP: 103/70 mmHg (04/13 1042) Pulse Rate: 111 (04/13 1042)  Labs:  Recent Labs  05/13/13 1506 05/16/13 1027  HGB 11.2*  --   HCT 33.8*  --   PLT 218  --   CREATININE 3.04* 3.35*    Estimated Creatinine Clearance: 23.6 ml/min (by C-G formula based on Cr of 3.35).   Medical History: Past Medical History  Diagnosis Date  . DVT (deep venous thrombosis)     both legs  . Gout   . Arthritis     osteo and saroactic  . Family history of anesthesia complication     Daughter- N/V  . Peripheral vascular disease   . Anxiety   . Depression     per family  . Perforated diverticulum of duodenum 09/2011  . BPH (benign prostatic hyperplasia)   . GERD (gastroesophageal reflux disease)   . Hernia of abdominal wall   . Cancer     Skin cancerrighgt arm- basal  . History of blood transfusion     hip surgery  . Rotator cuff injury     left  . Chronic kidney disease     Stage IV CKD; Dr. Dolores Frame at [facility] in [place]  . Dysrhythmia     irregular one time  . A-fib     Dr. Donn Pierini in [place]  . Hypertension   . Shortness of breath   . Complication of anesthesia     BP dropped with last surgery in 12/14     Assessment:  CC: admit for kyphoplasty 4/10  PMH: Afib, DVT, CKD  AC: Xarelto 15mg  pta for Afib last dose 4/5 for planned OR - restart 4/12 - cbc stable dose ok CrCl  25   some discussion in notes about DVT while on Coumadin but no mention of INRs at that time - continue current dose for Afib   ID: Afebrile, WBC WNL, no abx  CV: No HF Hx but has post op LE edema - TRH contact cardiologist in [place] EF 45-50% - stop IVF, Afib-RVR on Dilt add low dose toprol, IV furos  Endo:   GI/Nutr:   Neuro: awake alert when not taking gabapentin - adding to allergy list  Renal: CKD 4 Cr 3 CrCl 25 - ok for Xarelto- Follow with cards.nephrologist in [place]   Goal of Therapy:  Monitor platelets by anticoagulation protocol: Yes   Plan:  Rivaroxaban 15mg  daily   Bonnita Nasuti Pharm.D. CPP, BCPS Clinical Pharmacist [phone] 05/16/2013 1:46 PM

## 2013-05-16 NOTE — Progress Notes (Signed)
Subjective: Patient reports "My back isn't hurting anymore, just this side"  Objective: Vital signs in last 24 hours: Temp:  [97.4 F (36.3 C)-98.9 F (37.2 C)] 97.9 F (36.6 C) (04/13 0529) Pulse Rate:  [46-125] 100 (04/13 0529) Resp:  [20-21] 20 (04/13 0529) BP: (92-153)/(50-78) 92/50 mmHg (04/13 0529) SpO2:  [87 %-100 %] 97 % (04/13 0529)  Intake/Output from previous day: 04/12 0701 - 04/13 0700 In: 120 [P.O.:120] Out: 600 [Urine:600] Intake/Output this shift:    Alert, conversant, sitting up in chair. Reports no back pain, only right flank & abdomen pain. Off Gabapentin now d/t side effects. Pitting edema 2+ BLE to knees. RLL coarse crackles, diminished LLL. O2 in use via [place], but pt denies difficulties.  Strength is good BLE. Kypho sites with Dermabond. No erythema or swelling.   Lab Results:  Recent Labs  05/13/13 1506  WBC 8.8  HGB 11.2*  HCT 33.8*  PLT 218   BMET  Recent Labs  05/13/13 1506  NA 138  K 4.8  CL 97  CO2 21  GLUCOSE 114*  BUN 51*  CREATININE 3.04*  CALCIUM 9.4    Studies/Results: No results found.  Assessment/Plan: Pain improved; increasing BLE edema.   LOS: 3 days  Planning for SNF, will request Medicine consult for lower extremity edema. Continue to mobilize.     Aaron Edelman Tyrece Vanterpool 05/16/2013, 8:19 AM

## 2013-05-16 NOTE — Progress Notes (Signed)
Physical Therapy Treatment Patient Details Name: Christopher Marshall MRN: [id number] DOB: 30-Dec-1934 Today's Date: 05/16/2013    History of Present Illness Patient is a 79 yo male admitted with back pain from T9 compression fracture.  Patient s/p T9-10 kyphoplasty.  Patient with h/o T8 kyphoplasty.    PT Comments    Patient ambulating in hall today, tolerated additional therapeutic activity today. Feel patient is progressing towards goals, will continue to see as indicated.  Follow Up Recommendations  SNF;Supervision/Assistance - 24 hour (Per family, patient now going to SNF)     Equipment Recommendations  None recommended by PT    Recommendations for Other Services       Precautions / Restrictions Precautions Precautions: Fall Restrictions Weight Bearing Restrictions: No    Mobility  Bed Mobility                  Transfers Overall transfer level: Needs assistance Equipment used: Rolling walker (2 wheeled) Transfers: Sit to/from Stand Sit to Stand: Min assist;+2 safety/equipment         General transfer comment: VCs for safety and hand placement, Assist for stability and to elevate to standing  Ambulation/Gait Ambulation/Gait assistance: Min guard;Min assist Ambulation Distance (Feet): 160 Feet (then an additional 100 ft after standing rest break) Assistive device: Rolling walker (2 wheeled) Gait Pattern/deviations: Step-through pattern;Decreased stride length;Trunk flexed Gait velocity: Slow Gait velocity interpretation: Below normal speed for age/gender General Gait Details: VCs for upright posture and position within RW. Cues for breathing during ambulation   Stairs            Wheelchair Mobility    Modified Rankin (Stroke Patients Only)       Balance           Standing balance support: Bilateral upper extremity supported Standing balance-Leahy Scale: Poor (to fair) Standing balance comment: standing to urinate, supported by RW,  occassional ability to stand without support Single Leg Stance - Right Leg:  (able to stand 10 sec each leg with RW support) Single Leg Stance - Left Leg:  (able to stand 10 sec each leg with RW support)                Cognition Arousal/Alertness: Awake/alert Behavior During Therapy: WFL for tasks assessed/performed Overall Cognitive Status: Within Functional Limits for tasks assessed                 General Comments:  (Recieved EOB with OT)    Exercises      General Comments General comments (skin integrity, edema, etc.): patient performed LE therapeutic exercises by performing bilateral knee flexion to propel chair 100 ft back to room      Pertinent Vitals/Pain NAD    Home Living                      Prior Function            PT Goals (current goals can now be found in the care plan section) Acute Rehab PT Goals PT Goal Formulation: With patient/family Time For Goal Achievement: 05/21/13 Potential to Achieve Goals: Good Progress towards PT goals: Progressing toward goals    Frequency  Min 3X/week    PT Plan Discharge plan needs to be updated    Co-evaluation             End of Session Equipment Utilized During Treatment: Gait belt;Oxygen Activity Tolerance: Patient limited by pain;Patient limited by fatigue Patient left: in chair;with call  bell/phone within reach;with chair alarm set;with family/visitor present     Time: 3244-0102 PT Time Calculation (min): 20 min  Charges:  $Gait Training: 8-22 mins                    G Codes:      Duncan Dull 2013-05-19, 3:38 PM Alben Deeds, [place] DPT  [phone]

## 2013-05-16 NOTE — Progress Notes (Signed)
Occupational Therapy Treatment Patient Details Name: TRACKER MANCE MRN: [id number] DOB: [birth year] Today's Date: 05/16/2013    History of present illness Patient is a 79 yo male admitted with back pain from T9 compression fracture.  Patient s/p T9-10 kyphoplasty.  Patient with h/o T8 kyphoplasty.   OT comments  Pt progressing slowly toward OT goals. Pt now plans to d/c SNF level. Recommendations updated. Pt with urgency to void bladder now with new medications. Pt using urinal MOD I supine.  Follow Up Recommendations  SNF    Equipment Recommendations  Other (comment) (defer to SNF)    Recommendations for Other Services      Precautions / Restrictions Precautions Precautions: Fall;Back Restrictions Weight Bearing Restrictions: No       Mobility Bed Mobility Overal bed mobility: Needs Assistance Bed Mobility: Supine to Sit     Supine to sit: Min assist;HOB elevated     General bed mobility comments: verbal cues for safety  Transfers Overall transfer level: Needs assistance Equipment used: Standard walker Transfers: Sit to/from Stand Sit to Stand: Min assist;+2 safety/equipment         General transfer comment: VCs for safety and hand placement, Assist for stability and to elevate to standing    Balance           Standing balance support: Bilateral upper extremity supported Standing balance-Leahy Scale: Poor (to fair) Standing balance comment: standing to urinate, supported by RW, occassional ability to stand without support Single Leg Stance - Right Leg:  (able to stand 10 sec each leg with RW support) Single Leg Stance - Left Leg:  (able to stand 10 sec each leg with RW support)               ADL Overall ADL's : Needs assistance/impaired     Grooming: Wash/dry hands;Minimal assistance                   Toilet Transfer: Minimal assistance;Ambulation   Toileting- Clothing Manipulation and Hygiene: Minimal assistance;Sit to/from stand        Functional mobility during ADLs: Minimal assistance;Rolling walker General ADL Comments: Pt requires (A) for sit<.stand at this time. pt requires verbal cues for safety with bed mobilty.Pt reports pain in Rt side without specific detail. pt progressed to bathroom level voiding bladder.      Vision                     Perception     Praxis      Cognition   Behavior During Therapy: WFL for tasks assessed/performed Overall Cognitive Status: Within Functional Limits for tasks assessed                  General Comments:  (Recieved EOB with OT)    Extremity/Trunk Assessment               Exercises     Shoulder Instructions       General Comments      Pertinent Vitals/ Pain       Rt side pain not rated  Home Living                                          Prior Functioning/Environment              Frequency Min 2X/week     Progress Toward Goals  OT Goals(current goals can now be found in the care plan section)  Progress towards OT goals: Progressing toward goals  Acute Rehab OT Goals Patient Stated Goal: To get stronger OT Goal Formulation: With patient/family Time For Goal Achievement: 05/28/13 Potential to Achieve Goals: Good  Plan Discharge plan needs to be updated    Co-evaluation                 End of Session     Activity Tolerance Patient tolerated treatment well   Patient Left Other (comment) (ending session with PT [facility])   Nurse Communication Mobility status;Precautions        Time: 6314-9702 OT Time Calculation (min): 9 min  Charges: OT General Charges $OT Visit: 1 Procedure OT Treatments $Self Care/Home Management : 8-22 mins  Peri Maris 05/16/2013, 4:14 PM Pager: [phone]

## 2013-05-16 NOTE — Progress Notes (Signed)
OT Cancellation Note  Patient Details Name: Christopher Marshall MRN: [id number] DOB: December 31, 1934   Cancelled Treatment:     Pt currently off floor at procedure. OT to reattempt at later time today.  Peri Maris Pager: [phone]  05/16/2013, 9:49 AM

## 2013-05-16 NOTE — Consult Note (Addendum)
WOC wound consult note Reason for Consult: Consult requested for sacrum and bilat legs.   Wound type:Bilat legs with generalized edema and scaly areas of skin; appearance consistent with venous stasis changes.  Currently, no open wounds or drainage. Sacrum/buttocks with yellow dried scabs and red, moist denuded areas and patchy areas of partial thickness skin loss.  No pressure ulcers. Dressing procedure/placement/frequency: Eucerin to bilat legs to assist with removal of loose nonviable skin.  If open wound or leakage occurs, then foam dressing may be applied.  Foam dressing intact to sacrum and buttocks to protect site from constant friction.  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  Located over large bulge with appearance consistent with peristomal hernia. Stomal assessment/size: Stoma red and viable, 1 3/4 inches. Peristomal assessment: intact skin surrounding Output  Small amt semi-formed brown stool Ostomy pouching: 2pc.  Education provided: Pt has own supplies from home at bedside and uses 2 piece closed-end pouches.  Current pouch leaking and applied another from patient's supplies.  He does not want to change the pouching system he has and we do not stock the size or model the patient is using.  He denies need for assistance and states his family can bring more supplies from home if needed. Please re-consult if further assistance is needed.  Thank-you,  Julien Girt MSN, [place], [place], [place], [place]

## 2013-05-17 DIAGNOSIS — S22009A Unspecified fracture of unspecified thoracic vertebra, initial encounter for closed fracture: Secondary | ICD-10-CM

## 2013-05-17 LAB — BASIC METABOLIC PANEL
BUN: 55 mg/dL — ABNORMAL HIGH (ref 6–23)
CALCIUM: 8.8 mg/dL (ref 8.4–10.5)
CO2: 27 mEq/L (ref 19–32)
Chloride: 99 mEq/L (ref 96–112)
Creatinine, Ser: 3.47 mg/dL — ABNORMAL HIGH (ref 0.50–1.35)
GFR, EST AFRICAN AMERICAN: 18 mL/min — AB (ref >90)
GFR, EST NON AFRICAN AMERICAN: 16 mL/min — AB (ref >90)
Glucose, Bld: 83 mg/dL (ref 70–99)
POTASSIUM: 4.1 meq/L (ref 3.7–5.3)
SODIUM: 140 meq/L (ref 137–147)

## 2013-05-17 NOTE — Progress Notes (Signed)
Subjective: Patient reports "My back's ok. This burning is still in my side"  Objective: Vital signs in last 24 hours: Temp:  [97.3 F (36.3 C)-98.3 F (36.8 C)] 97.4 F (36.3 C) (04/14 0524) Pulse Rate:  [77-111] 77 (04/14 0524) Resp:  [18-20] 18 (04/14 0524) BP: (93-120)/(67-84) 96/67 mmHg (04/14 0524) SpO2:  [90 %-98 %] 92 % (04/14 0524) Weight:  [97.841 kg (215 lb 11.2 oz)-102.241 kg (225 lb 6.4 oz)] 97.841 kg (215 lb 11.2 oz) (04/14 0524)  Intake/Output from previous day: 04/13 0701 - 04/14 0700 In: 1160 [P.O.:1160] Out: 1025 [Urine:1025] Intake/Output this shift:    Awakens to voice. Reports persistent neuropathic pain right lateral rib/abdomen. Good strength BLE. Pitting edema remains BLE to knees, but softer. Pt reports voiding frequently on IV Lasix - reassured.   Lab Results: No results found for this basename: WBC, HGB, HCT, PLT,  in the last 72 hours BMET  Recent Labs  05/16/13 1027  NA 139  K 4.3  CL 99  CO2 25  GLUCOSE 141*  BUN 52*  CREATININE 3.35*  CALCIUM 9.0    Studies/Results: Dg Chest 2 View  05/16/2013   CLINICAL DATA:  Shortness of breath, crackles, weakness, history hypertension  EXAM: CHEST  2 VIEW  COMPARISON:  .: COMPARISON:  .  04/26/2013  FINDINGS: Enlargement of cardiac silhouette.  Tortuous aorta.  Large hiatal hernia.  Slight pulmonary vascular congestion.  Low lung volumes with bibasilar atelectasis.  No definite infiltrate, pleural effusion or pneumothorax.  Marked osseous demineralization with prior vertebroplasties at 6 adjacent lower thoracic vertebrae.  IMPRESSION: Large hiatal hernia.  Mild enlargement of cardiac silhouette.  Bibasilar atelectasis.   Electronically Signed   By: Lavonia Dana M.D.   On: 05/16/2013 11:47    Assessment/Plan:   LOS: 4 days  Awaits FL2 for signature; planning for SNF when ok for d/c from medical perspective. Appreciate Hospitalist's assistance. Continue to mobilize with PT.   Aaron Edelman Annamay Laymon 05/17/2013,  7:39 AM

## 2013-05-17 NOTE — Progress Notes (Deleted)
   CARE MANAGEMENT NOTE 05/17/2013  Patient:  Christopher Marshall,Christopher Marshall   Account Number:  [id number]  Date Initiated:  05/17/2013  Documentation initiated by:  Mansi Tokar  Subjective/Objective Assessment:   ADMITTED WITH Nausea vomiting diarrhea with abdominal pain /proctocolitis     Action/Plan:   CM FOLLOWING FOR DCP   Anticipated DC Date:  05/20/2013   Anticipated DC Plan:  HOME/SELF CARE      DC Planning Services  CM consult         Status of service:  In process, will continue to follow Medicare Important Message given?  NA - LOS <3 / Initial given by admissions (If response is "NO", the following Medicare IM given date fields will be blank)  Per UR Regulation:  Reviewed for med. necessity/level of care/duration of stay  Comments:  05/17/2013- B Lakyn Alsteen RN,BSN,MHA [phone]  

## 2013-05-17 NOTE — Progress Notes (Signed)
Patient's edema is improving.  We appreciate IM help.  Transfer to SNF when bed available.

## 2013-05-17 NOTE — Progress Notes (Signed)
Received call from [facility] with [facility] in [place]; patient is too high functioning for inpt rehab and does not qualify to be admitted; CM informed patient and family members; Patient is very happy and wants to go home and requested [facility] to provide [facility] services; no DME needed; Attending MD at discharge if in agreement, please order HHRN/ PT; Aneta Mins [phone]

## 2013-05-17 NOTE — Clinical Social Work Psychosocial (Signed)
Clinical Social Work Department BRIEF PSYCHOSOCIAL ASSESSMENT 05/17/2013  Patient:  Christopher Marshall, Christopher Marshall     Account Number:  [id number]     Admit date:  05/13/2013  Clinical Social Worker:  Donna Christen  Date/Time:  05/17/2013 10:13 AM  Referred by:  Physician  Date Referred:  05/17/2013 Referred for  SNF Placement   Other Referral:   none.   Interview type:  Family Other interview type:   CSW spoke with pt's wife, Izora Gala.    PSYCHOSOCIAL DATA Living Status:  WIFE Admitted from facility:   Level of care:   Primary support name:  Gardner Candle Primary support relationship to patient:  SPOUSE Degree of support available:   Strong support system.    CURRENT CONCERNS Current Concerns  Post-Acute Placement   Other Concerns:   none.    SOCIAL WORK ASSESSMENT / PLAN CSW received consult for possible SNF placement at time of discharge. Pt's wife provided pt's RN with contact information for [place], [place] rehabilitation facility pt's wife was interested in. CSW contacted admissions liaison with facility. CSW was informed pt's wife preference for rehabilitation is the [facility].    CSW consulted RNCM regarding acute to acute transfer. RNCM actively working on transfer. CSW updated pt's wife on transfer of care from [place] to [facility]. Pt's wife understanding and agreeable.   Assessment/plan status:  Psychosocial Support/Ongoing Assessment of Needs Other assessment/ plan:   none.   Information/referral to community resources:   none.    PATIENTS/FAMILYS RESPONSE TO PLAN OF CARE: Pt's wife understanding and agreeable to [facility] plan of care. CSW signing off as pt receiving acute to acute transfer.       Pati Gallo, [place] Social Worker [phone]

## 2013-05-17 NOTE — Progress Notes (Signed)
[facility]                                                                              Patient Demographics  Christopher Marshall, is a 79 y.o. male, DOB - Mar 16, 1934, GLO:[id number]  Admit date - 05/13/2013   Admitting Physician Erline Levine, MD  Outpatient Primary MD for the patient is Quentin Cornwall, MD  LOS - 4   No chief complaint on file.    Interim history: This is a 79 year old male with a history of atrial fibrillation, DVT, arthritis, thoracic compression fractures that has undergone kyphoplasty of T9-T10 by neurosurgery. Patient currently has lower extremity edema. Per patient and his wife who is at bedside, patient does not have any history of congestive heart failure, although has a history of atrial fibrillation and DVT. TRH was consulted for medical management of lower extremity edema. Started patient on 40 of IV Lasix twice daily. Continue to monitor intake and output as well as daily weights. Spoke to his cardiologist and nephrologist in [place], [place] .  Currently no shortness of breath.  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 Sondra Come in [place], [place] to obtain recent echocardiogram results. -Patient does have an ischemic cardiomyopathy, EF of 45-50%. -Chest x-ray showed bilateral cardiac silhouette, bibasilar atelectasis -Continue Lasix 40 mg IV twice a day -Wound care was also consulted -Continue fluid restriction, monitoring daily weights as well as intake and output -Patient had over 1 L of urine output over the last 24 hours.  Ischemic cardiomyopathy, EF of 40-50% -Treatment plan as above -BNP 6872, has been slightly higher in the past  Atrial Fibrilliation  -Rate controlled. Continue Xarelto and diltiazem, metoprolol.   History of DVT  -Patient developed DVT  while on coumadin therapy and was switched to Xarelto, per his nephrologist in [place], [place].   Hypertension  -Controlled, continue lasix and metoprolol.  CKD, stage 4  -Creatinine appears to be at baseline of 3.1-3.3.   -Spoke with nephrologist in [place], [place]. He has already had venous mapping.  -Creatinine slightly elevated today at 3.47 -Will continue to monitor BMP  Hypothyroidism  -Continue synthroid.   Gout  -Continue allopurinol.   GERD  -Continue PPI.   BPH  -Continue Flomax/Proscar   Code Status: Full  Family Communication: Wife at bedside.  Disposition Plan: Admitted, will be discharged likely to skilled nursing facility on 05/18/2013 by surgery  Time Spent in minutes   30 minutes  Procedures  Kyphoplasty of T9-T10  Consults   TRH  DVT Prophylaxis  Xarelto/SCDs  Lab Results  Component Value Date   PLT 218 05/13/2013    Medications  Scheduled Meds: . allopurinol  100 mg Oral Daily  . cholecalciferol  1,000 Units Oral Daily  . diltiazem  240 mg Oral Daily  . docusate sodium  100 mg Oral BID  . ferrous gluconate  324 mg Oral Q breakfast  . finasteride  5 mg Oral Daily  . furosemide  40 mg Intravenous Daily  . hydrocerin  Topical BID  . levothyroxine  75 mcg Oral QAC breakfast  . metoprolol tartrate  12.5 mg Oral BID  . mirtazapine  15 mg Oral QHS  . pantoprazole  40 mg Oral Q1200  . predniSONE  7.5 mg Oral Q breakfast  . rivaroxaban  15 mg Oral Q supper  . senna  1 tablet Oral BID  . tamsulosin  0.4 mg Oral QHS   Continuous Infusions:  PRN Meds:.acetaminophen, acetaminophen, bisacodyl, diazepam, docusate sodium, HYDROcodone-acetaminophen, HYDROmorphone (DILAUDID) injection, HYDROmorphone, menthol-cetylpyridinium, ondansetron (ZOFRAN) IV, oxyCODONE-acetaminophen, phenol, polyethylene glycol  Antibiotics    Anti-infectives   Start     Dose/Rate Route Frequency Ordered Stop   05/13/13 0600  vancomycin (VANCOCIN) 1,500 mg in sodium chloride  0.9 % 500 mL IVPB     1,500 mg 250 mL/hr over 120 Minutes Intravenous On call to O.R. 05/12/13 1423 05/13/13 1934      Subjective:   Christopher Marshall seen and examined today.  Patient has no complaints today. Denies any shortness of breath or chest pain at this time. Does state he kept his legs elevated yesterday for a short while. Complains of having to go to the bathroom due to his Lasix.  Objective:   Filed Vitals:   05/16/13 1831 05/16/13 2108 05/17/13 0140 05/17/13 0524  BP: 107/70 120/82 93/71 96/67   Pulse: 84 81 78 77  Temp: 98.3 F (36.8 C) 98.1 F (36.7 C) 97.7 F (36.5 C) 97.4 F (36.3 C)  TempSrc: Oral Oral Oral Oral  Resp: 20 18 18 18   Height:      Weight:    97.841 kg (215 lb 11.2 oz)  SpO2: 90% 98% 96% 92%    Wt Readings from Last 3 Encounters:  05/17/13 97.841 kg (215 lb 11.2 oz)  05/17/13 97.841 kg (215 lb 11.2 oz)  04/21/13 108.863 kg (240 lb)     Intake/Output Summary (Last 24 hours) at 05/17/13 0825 Last data filed at 05/17/13 0603  Gross per 24 hour  Intake   1160 ml  Output   1025 ml  Net    135 ml   Exam General: Well developed, well nourished, NAD, appears stated age  HEENT: NCAT, PERRLA, EOMI, Anicteic Sclera, mucous membranes moist.  Neck: Supple, no JVD, no masses  Cardiovascular: S1 S2 auscultated, no rubs, murmurs or gallops. Irregular  Respiratory: Diminished breath sounds, improving Abdomen: Soft, obese, nontender, nondistended, + bowel sounds  Extremities: warm dry without cyanosis clubbing. 3+ pitting edema in the lower ext bilaterally- extending to the knee  Neuro: AAOx3, cranial nerves grossly intact. Strength equal and bilateral upper and lower extremities.  Skin: Without rashes exudates or nodules, venous stasis skin changes on LE b/l  Psych: Normal affect and demeanor with intact judgement and insight  Data Review   Micro Results No results found for this or any previous visit (from the past 240 hour(s)).  Radiology  Reports Dg Chest 2 View  05/16/2013   CLINICAL DATA:  Shortness of breath, crackles, weakness, history hypertension  EXAM: CHEST  2 VIEW  COMPARISON:  .: COMPARISON:  .  04/26/2013  FINDINGS: Enlargement of cardiac silhouette.  Tortuous aorta.  Large hiatal hernia.  Slight pulmonary vascular congestion.  Low lung volumes with bibasilar atelectasis.  No definite infiltrate, pleural effusion or pneumothorax.  Marked osseous demineralization with prior vertebroplasties at 6 adjacent lower thoracic vertebrae.  IMPRESSION: Large hiatal hernia.  Mild enlargement of cardiac silhouette.  Bibasilar atelectasis.   Electronically Signed   By: Elta Guadeloupe  Thornton Papas M.D.   On: 05/16/2013 11:47   Dg Chest 2 View  04/26/2013   CLINICAL DATA:  Preop, atrial fibrillation, pulmonary edema  EXAM: CHEST  2 VIEW  COMPARISON:  04/13/2013  FINDINGS: Cardiomegaly again noted. Large hiatal hernia with air-fluid level. Persistent left basilar atelectasis, scarring or chronic infiltrate. No pulmonary edema. Small right pleural effusion. Prior vertebroplasty lumbar spine. Osteopenia thoracic and lumbar spine.  IMPRESSION: Large hiatal hernia with air-fluid level. Persistent left basilar atelectasis, scarring or chronic infiltrate. No pulmonary edema. Small right pleural effusion. Prior vertebroplasty lumbar spine. Osteopenia thoracic and lumbar spine.   Electronically Signed   By: Lahoma Crocker M.D.   On: 04/26/2013 11:52   Dg Thoracic Spine 2 View  05/14/2013   CLINICAL DATA:  Kyphoplasty procedure  EXAM: DG C-ARM 1-60 MIN; THORACIC SPINE - 2 VIEW  : COMPARISON:  04/26/2013  FINDINGS: Kyphoplasty performed at multiple levels. Only 1 level was treated on the prior exam. Kyphoplasty cement appears well positioned within the vertebral bodies on the submitted images.  IMPRESSION: Multilevel kyphoplasty. Please refer to the procedure report for complete description.   Electronically Signed   By: Lajean Manes M.D.   On: 05/14/2013 07:26   Dg Thoracic  Spine 2 View  04/26/2013   CLINICAL DATA:  Kyphoplasty, thoracic fracture  EXAM: THORACIC SPINE - 2 VIEW; DG C-ARM 1-60 MIN  FLUOROSCOPY TIME:  2 min and 5 seconds  COMPARISON:  DG CHEST 2 VIEW dated 04/26/2013; MR OUTSIDE FILMS SPINE dated 04/07/2013; MR OUTSIDE FILMS SPINE dated 11/29/2012; DG THORACIC SPINE 2V dated 04/13/2013  FINDINGS: Two intraoperative fluoroscopic spot images demonstrates methylmethacrylate within a thoracic vertebral body labeled.  IMPRESSION: Single level thoracic spine kyphoplasty.   Electronically Signed   By: Kathreen Devoid   On: 04/26/2013 15:50   Dg C-arm 1-60 Min  05/14/2013   CLINICAL DATA:  Kyphoplasty procedure  EXAM: DG C-ARM 1-60 MIN; THORACIC SPINE - 2 VIEW  : COMPARISON:  04/26/2013  FINDINGS: Kyphoplasty performed at multiple levels. Only 1 level was treated on the prior exam. Kyphoplasty cement appears well positioned within the vertebral bodies on the submitted images.  IMPRESSION: Multilevel kyphoplasty. Please refer to the procedure report for complete description.   Electronically Signed   By: Lajean Manes M.D.   On: 05/14/2013 07:26   Dg C-arm 1-60 Min  04/26/2013   CLINICAL DATA:  Kyphoplasty, thoracic fracture  EXAM: THORACIC SPINE - 2 VIEW; DG C-ARM 1-60 MIN  FLUOROSCOPY TIME:  2 min and 5 seconds  COMPARISON:  DG CHEST 2 VIEW dated 04/26/2013; MR OUTSIDE FILMS SPINE dated 04/07/2013; MR OUTSIDE FILMS SPINE dated 11/29/2012; DG THORACIC SPINE 2V dated 04/13/2013  FINDINGS: Two intraoperative fluoroscopic spot images demonstrates methylmethacrylate within a thoracic vertebral body labeled.  IMPRESSION: Single level thoracic spine kyphoplasty.   Electronically Signed   By: Kathreen Devoid   On: 04/26/2013 15:50    CBC  Recent Labs Lab 05/13/13 1506  WBC 8.8  HGB 11.2*  HCT 33.8*  PLT 218  MCV 92.6  MCH 30.7  MCHC 33.1  RDW 15.2    Chemistries   Recent Labs Lab 05/13/13 1506 05/16/13 1027  NA 138 139  K 4.8 4.3  CL 97 99  CO2 21 25  GLUCOSE 114*  141*  BUN 51* 52*  CREATININE 3.04* 3.35*  CALCIUM 9.4 9.0  AST  --  17  ALT  --  10  ALKPHOS  --  108  BILITOT  --  0.4   ------------------------------------------------------------------------------------------------------------------ estimated creatinine clearance is 23.1 ml/min (by C-G formula based on Cr of 3.35). ------------------------------------------------------------------------------------------------------------------ No results found for this basename: HGBA1C,  in the last 72 hours ------------------------------------------------------------------------------------------------------------------ No results found for this basename: CHOL, HDL, LDLCALC, TRIG, CHOLHDL, LDLDIRECT,  in the last 72 hours ------------------------------------------------------------------------------------------------------------------ No results found for this basename: TSH, T4TOTAL, FREET3, T3FREE, THYROIDAB,  in the last 72 hours ------------------------------------------------------------------------------------------------------------------ No results found for this basename: VITAMINB12, FOLATE, FERRITIN, TIBC, IRON, RETICCTPCT,  in the last 72 hours  Coagulation profile No results found for this basename: INR, PROTIME,  in the last 168 hours  No results found for this basename: DDIMER,  in the last 72 hours  Cardiac Enzymes No results found for this basename: CK, CKMB, TROPONINI, MYOGLOBIN,  in the last 168 hours ------------------------------------------------------------------------------------------------------------------ No components found with this basename: POCBNP,     Samirah Scarpati D.O. on 05/17/2013 at 8:25 AM  Between 7am to 7pm - Pager - [phone]  After 7pm go to [email or web address] - password [email or web address]  And look for the night coverage person covering for me after hours  [facility] Office  [phone]

## 2013-05-17 NOTE — Progress Notes (Signed)
Received call from Tery Sanfilippo, pt Christopher Marshall are requesting to go to [facility] at discharge; clinical information faxed; CM awaiting callback to see if they will accept the patient in their facility for rehab; Mindi Slicker RN,BSN,MHA [phone]

## 2013-05-18 ENCOUNTER — Encounter (HOSPITAL_COMMUNITY): Payer: Self-pay | Admitting: Neurosurgery

## 2013-05-18 LAB — BASIC METABOLIC PANEL
BUN: 57 mg/dL — ABNORMAL HIGH (ref 6–23)
CHLORIDE: 98 meq/L (ref 96–112)
CO2: 27 mEq/L (ref 19–32)
Calcium: 9.5 mg/dL (ref 8.4–10.5)
Creatinine, Ser: 3.31 mg/dL — ABNORMAL HIGH (ref 0.50–1.35)
GFR calc Af Amer: 19 mL/min — ABNORMAL LOW (ref >90)
GFR calc non Af Amer: 17 mL/min — ABNORMAL LOW (ref >90)
Glucose, Bld: 99 mg/dL (ref 70–99)
POTASSIUM: 4.3 meq/L (ref 3.7–5.3)
SODIUM: 139 meq/L (ref 137–147)

## 2013-05-18 MED ORDER — OXYCODONE-ACETAMINOPHEN 5-325 MG PO TABS: 1.0000 | ORAL_TABLET | ORAL | Status: AC | PRN

## 2013-05-18 NOTE — Progress Notes (Signed)
After speaking with PT this morning and taking into account that patient had a fall last night while trying to use the urinal, I was concerned to DC home would not be the best option for the patient. I spoke to [employer] who said I could talk to case management about any other possible discharge avenues for this patient. I spoke to CSM and It turns out that since patient is cognizant and refusing any other discharge avenue other than home, that he of course can not be made to go somewhere he does not want to go and I understand. Will cont to monitor and get dc ready for home

## 2013-05-18 NOTE — Progress Notes (Signed)
Talked to patient about DCP, noted fall last pm- patient is refusing to go to inpt rehab or SNF at this time; Also Dr Wyline Copas talked to the patient about inpt rehab/SNF at discharge; patient refused stated ' I want to go home". Patient has lift chair at home, home health care services arranged with [facility] as requested; CM had long discussion with pt/ family members about DCP yesterday, if pt changed his mind about rehab to contact the PCP after discharge for arrangements; Mindi Slicker RN,BSN,MHA [phone]

## 2013-05-18 NOTE — Progress Notes (Signed)
Called Verdis Prime over RN concerns about him going home instead of a SNF, will confer with social work and case management and call brian back

## 2013-05-18 NOTE — Progress Notes (Signed)
Subjective: Patient reports "I feel fine. I didn't want to wet the bed so I stood to use the urinal & I kept going. I just skinned my finger thats all"  Objective: Vital signs in last 24 hours: Temp:  [97.5 F (36.4 C)-98.1 F (36.7 C)] 97.8 F (36.6 C) (04/15 0528) Pulse Rate:  [65-107] 107 (04/15 0528) Resp:  [18] 18 (04/15 0528) BP: (92-157)/(65-82) 103/65 mmHg (04/15 0528) SpO2:  [95 %-100 %] 96 % (04/15 0528) Weight:  [95.573 kg (210 lb 11.2 oz)] 95.573 kg (210 lb 11.2 oz) (04/15 0528)  Intake/Output from previous day: 04/14 0701 - 04/15 0700 In: 821 [P.O.:821] Out: 1100 [Urine:1100] Intake/Output this shift:    Alert, conversant, smiling. Admits to falling this morning when trying to stand alone to void. Denies injury. Good strength BLE. Ambulated with assistance yesterday. LE edema improving & pt verballizes understanding of fluid restriction plan. [place] [facility] would not accept pt. He does not want SNF in [place] d/t previous experience; and states his wife and daughters will be able to help him at home.  Lab Results: No results found for this basename: WBC, HGB, HCT, PLT,  in the last 72 hours BMET  Recent Labs  05/17/13 0735 05/18/13 0350  NA 140 139  K 4.1 4.3  CL 99 98  CO2 27 27  GLUCOSE 83 99  BUN 55* 57*  CREATININE 3.47* 3.31*  CALCIUM 8.8 9.5    Studies/Results: Dg Chest 2 View  05/16/2013   CLINICAL DATA:  Shortness of breath, crackles, weakness, history hypertension  EXAM: CHEST  2 VIEW  COMPARISON:  .: COMPARISON:  .  04/26/2013  FINDINGS: Enlargement of cardiac silhouette.  Tortuous aorta.  Large hiatal hernia.  Slight pulmonary vascular congestion.  Low lung volumes with bibasilar atelectasis.  No definite infiltrate, pleural effusion or pneumothorax.  Marked osseous demineralization with prior vertebroplasties at 6 adjacent lower thoracic vertebrae.  IMPRESSION: Large hiatal hernia.  Mild enlargement of cardiac silhouette.  Bibasilar atelectasis.    Electronically Signed   By: Lavonia Dana M.D.   On: 05/16/2013 11:47    Assessment/Plan: Improving   LOS: 5 days  Ok per Dr. Vertell Limber to d/c pt to home with HHRN/PT. Pt verbalizes understanding of d/c instructions & agrees to call office to schedule f/u appt for 3-4 weeks.    Aaron Edelman Poteat 05/18/2013, 8:16 AM    After assessment this morning by PT, patient was felt to require additional therapies and that he is likely to benefit from Rehabilitation.  Consultation for Rehab has been made and we will hold off on discharge at this time.

## 2013-05-18 NOTE — Progress Notes (Signed)
Per Verdis Prime via telephone, patient is to "stop prednisone at discharge and continue his Jennye Moccasin." I wrote this on the patient's discharge paperwork and spoke to patient about this.

## 2013-05-18 NOTE — Progress Notes (Signed)
On call notified of fall, no new orders at this time. Will continue to monitor. Verdie Drown RN BSN.

## 2013-05-18 NOTE — Progress Notes (Signed)
Pt does not want staff calling wife at this time to notify of fall. States "I'm all right." Will continue to monitor. Messer, Elease Etienne RN BSN

## 2013-05-18 NOTE — Progress Notes (Signed)
Physical Therapy Treatment Patient Details Name: Christopher Marshall MRN: [id number] DOB: [birth year] Today's Date: 05/18/2013    History of Present Illness Patient is a 79 yo male admitted with back pain from T9 compression fracture.  Patient s/p T9-10 kyphoplasty.  Patient with h/o T8 kyphoplasty.    PT Comments    Patient seen today for therapy following fall last evening. Patient continues to demonstrate deficits in functional mobility and concerns for safety at this time. Given recent fall and patients decreased awareness of safety and deficits, feel patient is not safe for discharge home.  Patient pain levels have improved and feel patient would tolerated intense rehab at this time. This Probation officer believe that a short CIR stay may be beneficial to ensure safety, decrease falls (significant history of falls in past 6 months) and allow patient to return home with improved functional mobility.  Will continue to see as indicated and progress as tolerated.   Follow Up Recommendations  CIR;Supervision/Assistance - 24 hour     Equipment Recommendations  None recommended by PT    Recommendations for Other Services       Precautions / Restrictions Precautions Precautions: Fall;Back Restrictions Weight Bearing Restrictions: No    Mobility  Bed Mobility Overal bed mobility: Needs Assistance Bed Mobility: Supine to Sit     Supine to sit: Min assist;HOB elevated     General bed mobility comments: verbal cues for safety  Transfers Overall transfer level: Needs assistance Equipment used: Rolling walker (2 wheeled) Transfers: Sit to/from Stand Sit to Stand: Min assist;+2 safety/equipment         General transfer comment: Cues for technique and hand placement, Pt instructed to scoot forward in chair, assist provided to elevate to standing using gait belt and chuck bad with min assist with second person available to guard  Ambulation/Gait Ambulation/Gait assistance: Min guard;Min  assist (pt still requiring assistance at times with amb LOB noted x2) Ambulation Distance (Feet): 140 Feet (slighty more unsteady w/ amb today compared to prior session) Assistive device: Rolling walker (2 wheeled) Gait Pattern/deviations: Step-through pattern;Decreased stride length;Drifts right/left;Trunk flexed Gait velocity: Slow Gait velocity interpretation: Below normal speed for age/gender General Gait Details: VCs for upright posture and position within RW. Cues for breathing during ambulation   Stairs            Wheelchair Mobility    Modified Rankin (Stroke Patients Only)       Balance             Standing balance-Leahy Scale: Poor (to fair)                      Cognition Arousal/Alertness: Awake/alert Behavior During Therapy: WFL for tasks assessed/performed Overall Cognitive Status: Within Functional Limits for tasks assessed                 General Comments:  (Recieved EOB with OT)    Exercises      General Comments General comments (skin integrity, edema, etc.): reassessed patient stability, strength, sensation, and skin integreity following fall last evening. Patient continues to demonstrate generalized weakness bilaterally. Poor ability to maintain blanace safely without assistive device or hands on assist.      Pertinent Vitals/Pain 3/10    Home Living                      Prior Function            PT Goals (current goals can  now be found in the care plan section) Acute Rehab PT Goals Patient Stated Goal: to get home soon PT Goal Formulation: With patient/family Time For Goal Achievement: 05/21/13 Potential to Achieve Goals: Good Progress towards PT goals: Progressing toward goals    Frequency  Min 3X/week    PT Plan Discharge plan needs to be updated    Co-evaluation             End of Session Equipment Utilized During Treatment: Gait belt;Oxygen Activity Tolerance: Patient limited by pain;Patient  limited by fatigue Patient left: in chair;with call bell/phone within reach;with chair alarm set     Time: 7711-6579 PT Time Calculation (min): 26 min  Charges:  $Gait Training: 8-22 mins $Therapeutic Activity: 8-22 mins                    G Codes:      Duncan Dull 05/22/2013, 9:43 AM Alben Deeds, PT DPT  [phone]

## 2013-05-18 NOTE — Progress Notes (Signed)
This nurse heard patient yelling "help, Ive fallen." Upon enterting the room the pt was on left side on the floor beside bed. Pt denies hitting head, denies pain, neuros remain intact at this time. Patient has skin tear to right ring finger. Cleaned dry and dressed. Pt states " I thought I could stand up and use the urinal without having to call you." Gait belt used to assist patient back into chair. Pt currently resting in bed with no complaints. On call notified will continue to monitor. Verdie Drown RN, BSN

## 2013-05-18 NOTE — Discharge Summary (Signed)
Physician Discharge Summary  Patient ID: Christopher Marshall MRN: [id number] DOB/AGE: 1934/02/25 79 y.o.  Admit date: 05/13/2013 Discharge date: 05/18/2013  Admission Diagnoses: Thoracic fracture T 9 and T 10    Discharge Diagnoses: Thoracic fracture T 9 and T 10 s/p Thoracic Nine and Thoracic Ten KYPHOPLASTY     Active Problems:   Pathologic compression fracture of thoracic vertebra   A-fib   Gout   CKD (chronic kidney disease) stage 4, GFR 15-29 ml/min   Discharged Condition: good  Hospital Course: Christopher Marshall was admitted for surgery with dx T9 & T10 compression Fx.  Following uncomplicated kyphoplasties, he recovered well.  Hospital stay prolonged d/t comorbidities & resultant lower extremity edema. Appreciate assistance by Medicine Service for edema tx.    Consults: Hospitalist  Significant Diagnostic Studies: radiology: X-Ray: intra-operative  Treatments: surgery: Thoracic Nine and Thoracic Ten KYPHOPLASTY    Discharge Exam: Blood pressure 103/65, pulse 107, temperature 97.8 F (36.6 C), temperature source Oral, resp. rate 18, height 6\' 1"  (1.854 m), weight 95.573 kg (210 lb 11.2 oz), SpO2 96.00%. Alert, conversant, smiling. Admits to falling this morning when trying to stand alone to void. Denies injury. Good strength BLE. Ambulated with assistance yesterday. LE edema improving & pt verballizes understanding of fluid restriction plan. [place] would not accept pt. He does not want SNF in [place] d/t previous experience; and states his wife and daughters will be able to help him at home.    Disposition: 01-Home or Self Care  Discharge with HHPT/ RN. Pt will call office to schedule 3-4 week f/u with DrStern. Rx for Percocet to chart. Will stop prednisone; continue Xarelto.      Medication List    STOP taking these medications       Rivaroxaban 15 MG Tabs tablet  Commonly known as:  XARELTO      TAKE these medications       allopurinol 100 MG tablet  Commonly  known as:  ZYLOPRIM  Take 100 mg by mouth daily.     cholecalciferol 1000 UNITS tablet  Commonly known as:  VITAMIN D  Take 1,000 Units by mouth daily.     diltiazem 120 MG 24 hr capsule  Commonly known as:  CARDIZEM CD  Take 240 mg by mouth daily.     docusate sodium 100 MG capsule  Commonly known as:  COLACE  Take 100 mg by mouth 2 (two) times daily as needed for mild constipation.     ferrous gluconate 324 MG tablet  Commonly known as:  FERGON  Take 324 mg by mouth daily with breakfast.     finasteride 5 MG tablet  Commonly known as:  PROSCAR  Take 5 mg by mouth daily.     furosemide 40 MG tablet  Commonly known as:  LASIX  Take 40 mg by mouth daily.     gabapentin 300 MG capsule  Commonly known as:  NEURONTIN  Take 300 mg by mouth daily.     HYDROmorphone 2 MG tablet  Commonly known as:  DILAUDID  Take 1 tablet (2 mg total) by mouth every 4 (four) hours as needed for moderate pain or severe pain.     levothyroxine 75 MCG tablet  Commonly known as:  SYNTHROID, LEVOTHROID  Take 75 mcg by mouth daily before breakfast.     mirtazapine 15 MG tablet  Commonly known as:  REMERON  Take 15 mg by mouth at bedtime.     oxyCODONE-acetaminophen 5-325 MG per tablet  Commonly known as:  PERCOCET/ROXICET  Take 1-2 tablets by mouth every 4 (four) hours as needed for moderate pain.     pantoprazole 40 MG tablet  Commonly known as:  PROTONIX  Take 40 mg by mouth daily.     predniSONE 5 MG tablet  Commonly known as:  DELTASONE  Take 7.5 mg by mouth daily with breakfast.     tamsulosin 0.4 MG Caps capsule  Commonly known as:  FLOMAX  Take 0.4 mg by mouth at bedtime.         SignedVerdis Prime 05/18/2013, 8:35 AM

## 2013-07-04 DEATH — deceased

## 2015-01-08 IMAGING — CR DG CHEST 2V
2 series · 2 of 2 positions shown · non-contrast
Comparison: .:
COMPARISON: .

04/26/2013

CLINICAL DATA: Shortness of breath, crackles, weakness, history
hypertension

EXAM:
CHEST  2 VIEW

[w chest pa]
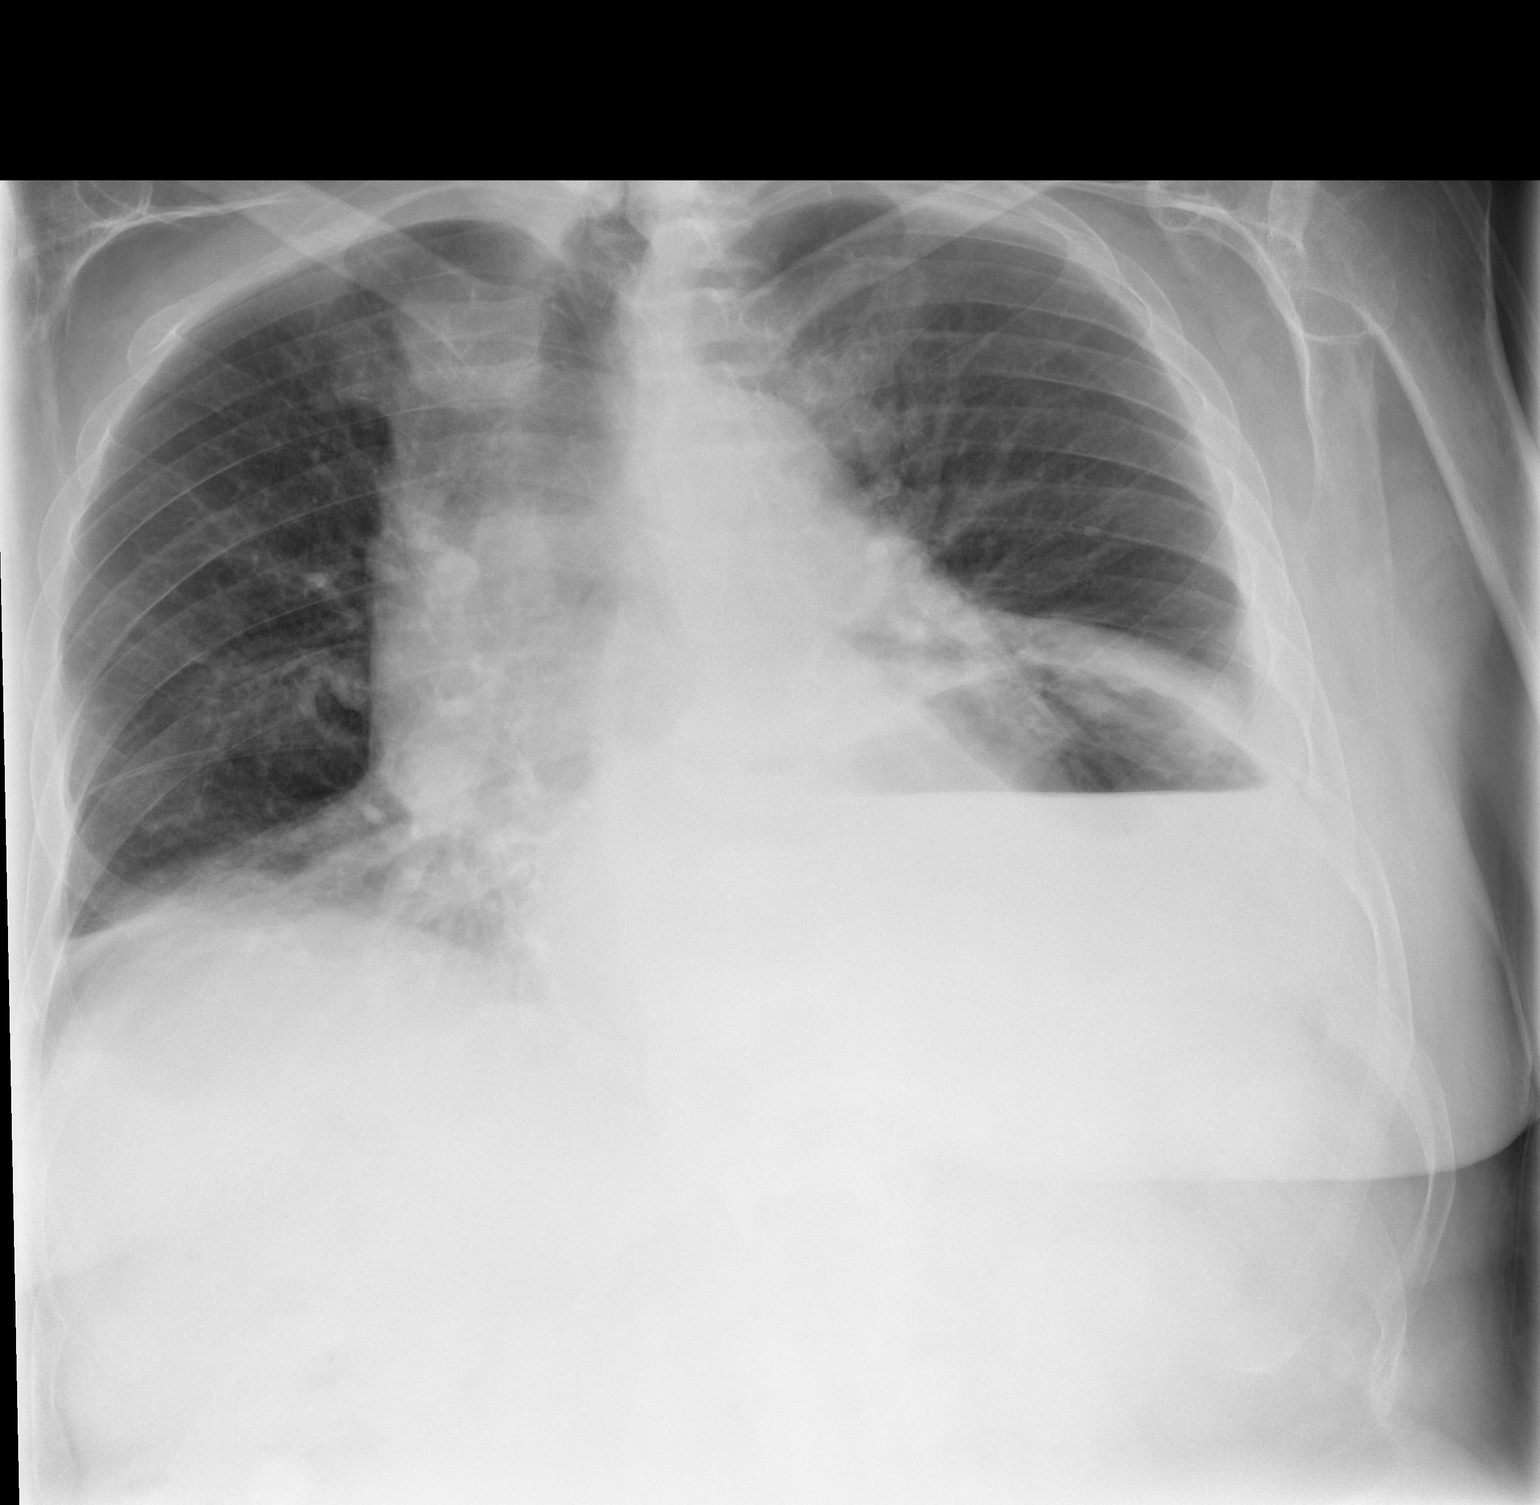

[w chest lat]
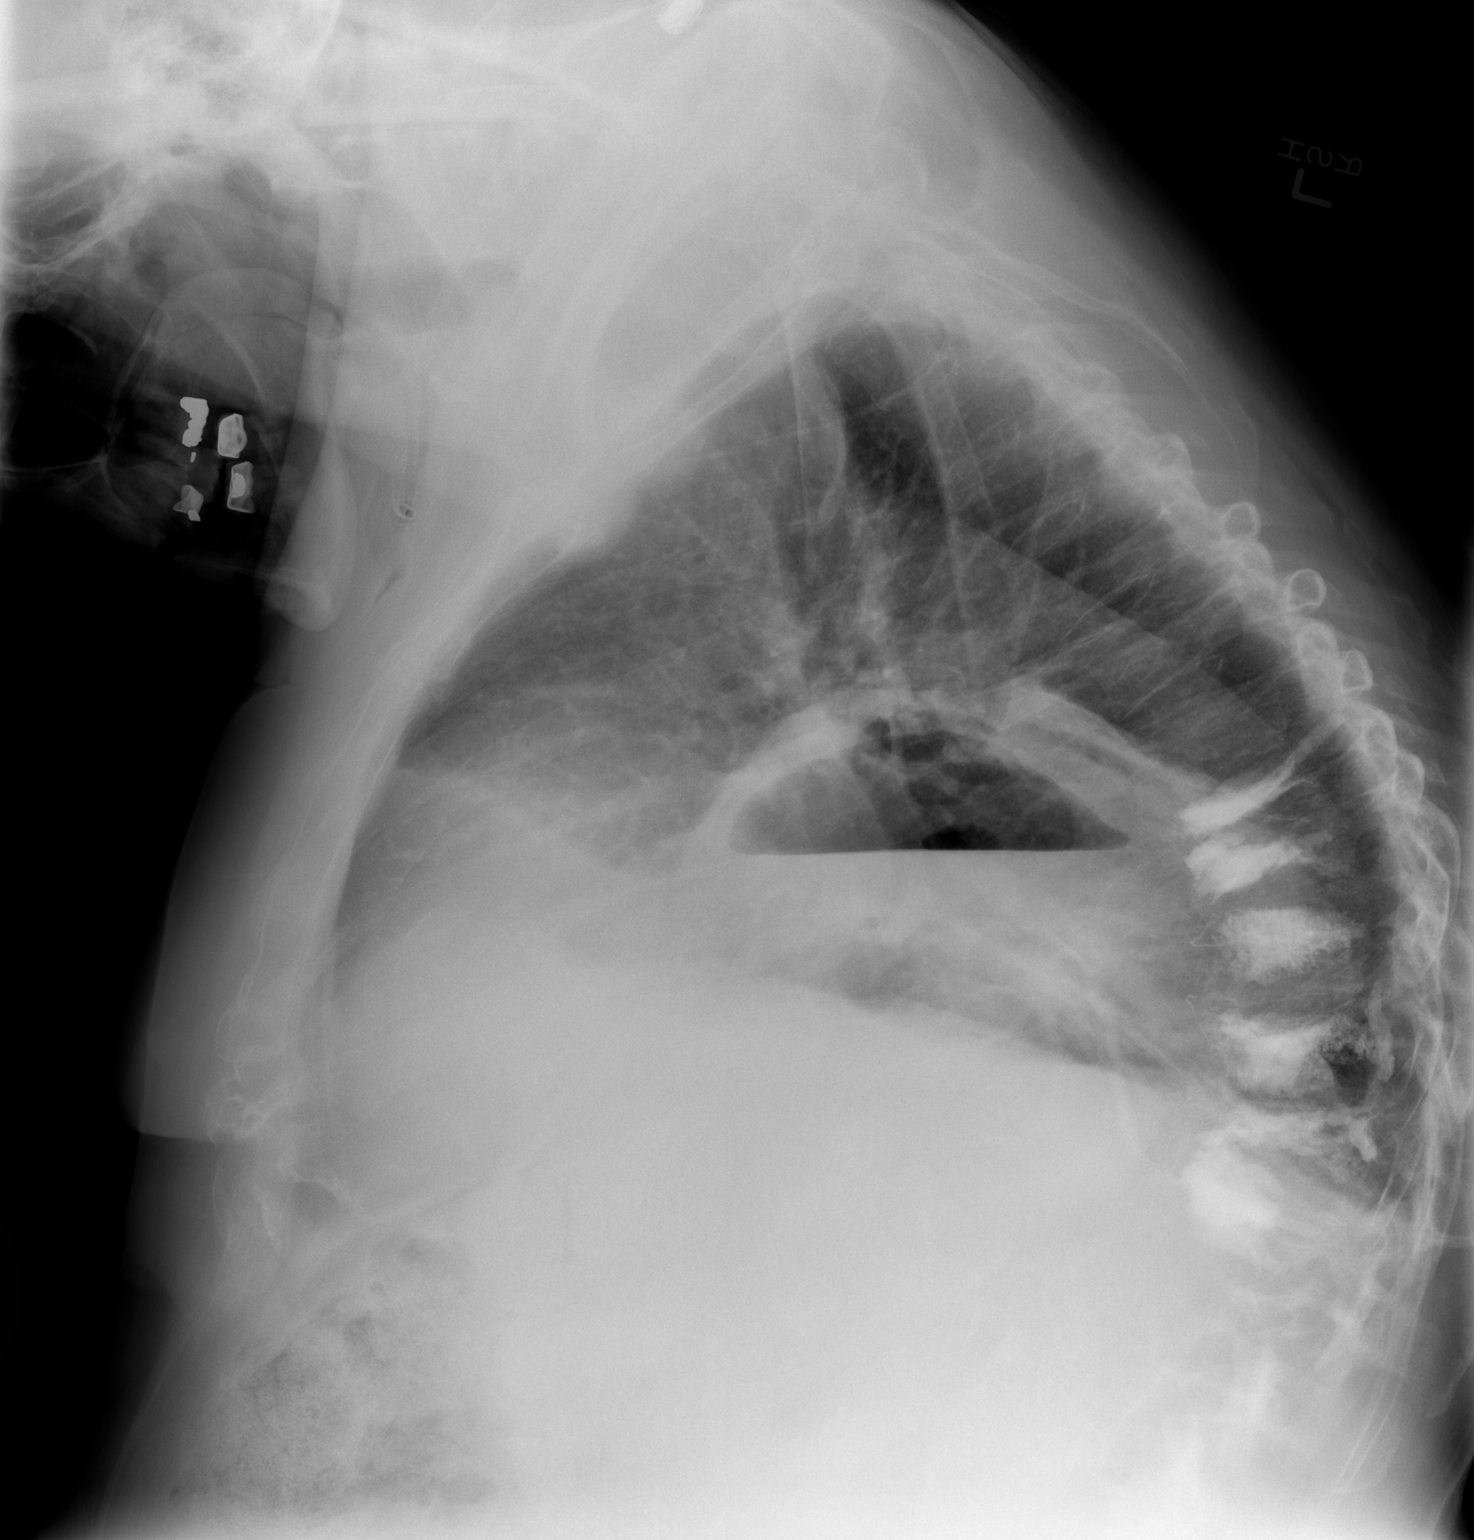

[2 of 2 positions shown; findings below may reference images not displayed]

FINDINGS: Enlargement of cardiac silhouette.

Tortuous aorta.

Large hiatal hernia.

Slight pulmonary vascular congestion.

Low lung volumes with bibasilar atelectasis.

No definite infiltrate, pleural effusion or pneumothorax.

Marked osseous demineralization with prior vertebroplasties at 6
adjacent lower thoracic vertebrae.
IMPRESSION: Large hiatal hernia.

Mild enlargement of cardiac silhouette.

Bibasilar atelectasis.
